# Patient Record
Sex: Male | Born: 1945 | Race: White | Hispanic: No | Marital: Married | Smoking: Former smoker
Health system: Southern US, Community
[De-identification: ages and names within clinical notes are randomized; demographics above are authoritative.]

## PROBLEM LIST (undated history)

## (undated) DIAGNOSIS — I10 Essential (primary) hypertension: Secondary | ICD-10-CM

## (undated) DIAGNOSIS — E7800 Pure hypercholesterolemia, unspecified: Secondary | ICD-10-CM

## (undated) DIAGNOSIS — M5430 Sciatica, unspecified side: Secondary | ICD-10-CM

## (undated) HISTORY — PX: OTHER SURGICAL HISTORY: SHX169

## (undated) HISTORY — PX: BACK SURGERY: SHX140

---

## 2001-01-16 ENCOUNTER — Encounter: Payer: Self-pay | Admitting: Neurosurgery

## 2001-01-18 ENCOUNTER — Encounter: Payer: Self-pay | Admitting: Neurosurgery

## 2001-01-18 ENCOUNTER — Inpatient Hospital Stay (HOSPITAL_COMMUNITY): Admission: RE | Admit: 2001-01-18 | Discharge: 2001-01-19 | Payer: Self-pay | Admitting: Neurosurgery

## 2014-06-23 DIAGNOSIS — I10 Essential (primary) hypertension: Secondary | ICD-10-CM | POA: Diagnosis not present

## 2014-06-23 DIAGNOSIS — E782 Mixed hyperlipidemia: Secondary | ICD-10-CM | POA: Diagnosis not present

## 2014-06-29 DIAGNOSIS — E782 Mixed hyperlipidemia: Secondary | ICD-10-CM | POA: Diagnosis not present

## 2014-06-29 DIAGNOSIS — R7309 Other abnormal glucose: Secondary | ICD-10-CM | POA: Diagnosis not present

## 2014-06-29 DIAGNOSIS — I10 Essential (primary) hypertension: Secondary | ICD-10-CM | POA: Diagnosis not present

## 2014-06-29 DIAGNOSIS — Z6826 Body mass index (BMI) 26.0-26.9, adult: Secondary | ICD-10-CM | POA: Diagnosis not present

## 2014-08-31 DIAGNOSIS — H524 Presbyopia: Secondary | ICD-10-CM | POA: Diagnosis not present

## 2014-08-31 DIAGNOSIS — H2513 Age-related nuclear cataract, bilateral: Secondary | ICD-10-CM | POA: Diagnosis not present

## 2014-09-29 DIAGNOSIS — R1310 Dysphagia, unspecified: Secondary | ICD-10-CM | POA: Diagnosis not present

## 2014-09-29 DIAGNOSIS — K219 Gastro-esophageal reflux disease without esophagitis: Secondary | ICD-10-CM | POA: Diagnosis not present

## 2014-09-29 DIAGNOSIS — Z6825 Body mass index (BMI) 25.0-25.9, adult: Secondary | ICD-10-CM | POA: Diagnosis not present

## 2014-10-05 DIAGNOSIS — K219 Gastro-esophageal reflux disease without esophagitis: Secondary | ICD-10-CM | POA: Diagnosis not present

## 2014-10-05 DIAGNOSIS — R1310 Dysphagia, unspecified: Secondary | ICD-10-CM | POA: Diagnosis not present

## 2014-10-13 DIAGNOSIS — R1310 Dysphagia, unspecified: Secondary | ICD-10-CM | POA: Diagnosis not present

## 2014-10-13 DIAGNOSIS — Z6825 Body mass index (BMI) 25.0-25.9, adult: Secondary | ICD-10-CM | POA: Diagnosis not present

## 2014-10-13 DIAGNOSIS — K219 Gastro-esophageal reflux disease without esophagitis: Secondary | ICD-10-CM | POA: Diagnosis not present

## 2014-10-19 DIAGNOSIS — M5432 Sciatica, left side: Secondary | ICD-10-CM | POA: Diagnosis not present

## 2014-10-27 DIAGNOSIS — R1310 Dysphagia, unspecified: Secondary | ICD-10-CM | POA: Diagnosis not present

## 2014-10-28 DIAGNOSIS — R339 Retention of urine, unspecified: Secondary | ICD-10-CM | POA: Diagnosis not present

## 2014-11-02 DIAGNOSIS — K259 Gastric ulcer, unspecified as acute or chronic, without hemorrhage or perforation: Secondary | ICD-10-CM | POA: Diagnosis not present

## 2014-11-02 DIAGNOSIS — K449 Diaphragmatic hernia without obstruction or gangrene: Secondary | ICD-10-CM | POA: Diagnosis not present

## 2014-11-02 DIAGNOSIS — R338 Other retention of urine: Secondary | ICD-10-CM | POA: Diagnosis not present

## 2014-11-02 DIAGNOSIS — R000 Tachycardia, unspecified: Secondary | ICD-10-CM | POA: Diagnosis not present

## 2014-11-02 DIAGNOSIS — M5417 Radiculopathy, lumbosacral region: Secondary | ICD-10-CM | POA: Diagnosis not present

## 2014-11-02 DIAGNOSIS — R1310 Dysphagia, unspecified: Secondary | ICD-10-CM | POA: Diagnosis not present

## 2014-11-02 DIAGNOSIS — K222 Esophageal obstruction: Secondary | ICD-10-CM | POA: Diagnosis not present

## 2014-11-02 DIAGNOSIS — K2210 Ulcer of esophagus without bleeding: Secondary | ICD-10-CM | POA: Diagnosis not present

## 2014-11-02 DIAGNOSIS — I959 Hypotension, unspecified: Secondary | ICD-10-CM | POA: Diagnosis not present

## 2014-11-04 ENCOUNTER — Emergency Department (HOSPITAL_COMMUNITY): Payer: Medicare Other

## 2014-11-04 ENCOUNTER — Inpatient Hospital Stay (HOSPITAL_COMMUNITY)
Admission: EM | Admit: 2014-11-04 | Discharge: 2014-11-12 | DRG: 542 | Disposition: A | Discharge status: Home Health | Payer: Medicare Other | Attending: Internal Medicine | Admitting: Internal Medicine

## 2014-11-04 ENCOUNTER — Inpatient Hospital Stay (HOSPITAL_COMMUNITY): Payer: Medicare Other

## 2014-11-04 ENCOUNTER — Encounter (HOSPITAL_COMMUNITY): Payer: Self-pay | Admitting: Emergency Medicine

## 2014-11-04 DIAGNOSIS — R339 Retention of urine, unspecified: Secondary | ICD-10-CM | POA: Diagnosis not present

## 2014-11-04 DIAGNOSIS — I82402 Acute embolism and thrombosis of unspecified deep veins of left lower extremity: Secondary | ICD-10-CM | POA: Diagnosis not present

## 2014-11-04 DIAGNOSIS — D649 Anemia, unspecified: Secondary | ICD-10-CM | POA: Diagnosis present

## 2014-11-04 DIAGNOSIS — C7951 Secondary malignant neoplasm of bone: Principal | ICD-10-CM | POA: Diagnosis present

## 2014-11-04 DIAGNOSIS — R5381 Other malaise: Secondary | ICD-10-CM | POA: Diagnosis present

## 2014-11-04 DIAGNOSIS — I10 Essential (primary) hypertension: Secondary | ICD-10-CM | POA: Diagnosis not present

## 2014-11-04 DIAGNOSIS — C7800 Secondary malignant neoplasm of unspecified lung: Secondary | ICD-10-CM | POA: Diagnosis present

## 2014-11-04 DIAGNOSIS — C7830 Secondary malignant neoplasm of unspecified respiratory organ: Secondary | ICD-10-CM | POA: Diagnosis not present

## 2014-11-04 DIAGNOSIS — C7931 Secondary malignant neoplasm of brain: Secondary | ICD-10-CM | POA: Diagnosis not present

## 2014-11-04 DIAGNOSIS — K922 Gastrointestinal hemorrhage, unspecified: Secondary | ICD-10-CM | POA: Diagnosis not present

## 2014-11-04 DIAGNOSIS — E872 Acidosis: Secondary | ICD-10-CM | POA: Diagnosis present

## 2014-11-04 DIAGNOSIS — D62 Acute posthemorrhagic anemia: Secondary | ICD-10-CM | POA: Diagnosis not present

## 2014-11-04 DIAGNOSIS — C799 Secondary malignant neoplasm of unspecified site: Secondary | ICD-10-CM | POA: Diagnosis present

## 2014-11-04 DIAGNOSIS — K921 Melena: Secondary | ICD-10-CM | POA: Diagnosis present

## 2014-11-04 DIAGNOSIS — R0600 Dyspnea, unspecified: Secondary | ICD-10-CM | POA: Diagnosis not present

## 2014-11-04 DIAGNOSIS — T380X5A Adverse effect of glucocorticoids and synthetic analogues, initial encounter: Secondary | ICD-10-CM | POA: Diagnosis present

## 2014-11-04 DIAGNOSIS — J9601 Acute respiratory failure with hypoxia: Secondary | ICD-10-CM | POA: Diagnosis not present

## 2014-11-04 DIAGNOSIS — N179 Acute kidney failure, unspecified: Secondary | ICD-10-CM | POA: Diagnosis not present

## 2014-11-04 DIAGNOSIS — R918 Other nonspecific abnormal finding of lung field: Secondary | ICD-10-CM

## 2014-11-04 DIAGNOSIS — M5442 Lumbago with sciatica, left side: Secondary | ICD-10-CM | POA: Diagnosis not present

## 2014-11-04 DIAGNOSIS — J441 Chronic obstructive pulmonary disease with (acute) exacerbation: Secondary | ICD-10-CM | POA: Diagnosis present

## 2014-11-04 DIAGNOSIS — R0602 Shortness of breath: Secondary | ICD-10-CM | POA: Diagnosis not present

## 2014-11-04 DIAGNOSIS — M549 Dorsalgia, unspecified: Secondary | ICD-10-CM | POA: Diagnosis not present

## 2014-11-04 DIAGNOSIS — Z87891 Personal history of nicotine dependence: Secondary | ICD-10-CM | POA: Diagnosis not present

## 2014-11-04 DIAGNOSIS — R000 Tachycardia, unspecified: Secondary | ICD-10-CM | POA: Diagnosis present

## 2014-11-04 DIAGNOSIS — R195 Other fecal abnormalities: Secondary | ICD-10-CM | POA: Diagnosis not present

## 2014-11-04 DIAGNOSIS — D630 Anemia in neoplastic disease: Secondary | ICD-10-CM | POA: Diagnosis not present

## 2014-11-04 DIAGNOSIS — C801 Malignant (primary) neoplasm, unspecified: Secondary | ICD-10-CM | POA: Diagnosis present

## 2014-11-04 DIAGNOSIS — R739 Hyperglycemia, unspecified: Secondary | ICD-10-CM | POA: Diagnosis not present

## 2014-11-04 DIAGNOSIS — R079 Chest pain, unspecified: Secondary | ICD-10-CM | POA: Diagnosis not present

## 2014-11-04 DIAGNOSIS — I959 Hypotension, unspecified: Secondary | ICD-10-CM | POA: Diagnosis present

## 2014-11-04 DIAGNOSIS — C787 Secondary malignant neoplasm of liver and intrahepatic bile duct: Secondary | ICD-10-CM | POA: Diagnosis present

## 2014-11-04 DIAGNOSIS — M545 Low back pain, unspecified: Secondary | ICD-10-CM | POA: Diagnosis present

## 2014-11-04 DIAGNOSIS — R338 Other retention of urine: Secondary | ICD-10-CM | POA: Diagnosis not present

## 2014-11-04 DIAGNOSIS — E785 Hyperlipidemia, unspecified: Secondary | ICD-10-CM | POA: Diagnosis not present

## 2014-11-04 DIAGNOSIS — N401 Enlarged prostate with lower urinary tract symptoms: Secondary | ICD-10-CM | POA: Diagnosis not present

## 2014-11-04 DIAGNOSIS — Z79899 Other long term (current) drug therapy: Secondary | ICD-10-CM

## 2014-11-04 DIAGNOSIS — G936 Cerebral edema: Secondary | ICD-10-CM | POA: Diagnosis present

## 2014-11-04 DIAGNOSIS — E871 Hypo-osmolality and hyponatremia: Secondary | ICD-10-CM | POA: Diagnosis present

## 2014-11-04 DIAGNOSIS — M5441 Lumbago with sciatica, right side: Secondary | ICD-10-CM | POA: Insufficient documentation

## 2014-11-04 DIAGNOSIS — J449 Chronic obstructive pulmonary disease, unspecified: Secondary | ICD-10-CM | POA: Diagnosis not present

## 2014-11-04 DIAGNOSIS — J189 Pneumonia, unspecified organism: Secondary | ICD-10-CM | POA: Diagnosis not present

## 2014-11-04 DIAGNOSIS — I82409 Acute embolism and thrombosis of unspecified deep veins of unspecified lower extremity: Secondary | ICD-10-CM | POA: Insufficient documentation

## 2014-11-04 DIAGNOSIS — M7989 Other specified soft tissue disorders: Secondary | ICD-10-CM | POA: Diagnosis not present

## 2014-11-04 HISTORY — DX: Pure hypercholesterolemia, unspecified: E78.00

## 2014-11-04 HISTORY — DX: Sciatica, unspecified side: M54.30

## 2014-11-04 HISTORY — DX: Essential (primary) hypertension: I10

## 2014-11-04 LAB — URINE MICROSCOPIC-ADD ON

## 2014-11-04 LAB — CBC
HCT: 23.8 % — ABNORMAL LOW (ref 39.0–52.0)
HEMATOCRIT: 23.5 % — AB (ref 39.0–52.0)
HEMOGLOBIN: 8.4 g/dL — AB (ref 13.0–17.0)
Hemoglobin: 8.1 g/dL — ABNORMAL LOW (ref 13.0–17.0)
MCH: 32.1 pg (ref 26.0–34.0)
MCH: 31.4 pg (ref 26.0–34.0)
MCHC: 35.3 g/dL (ref 30.0–36.0)
MCHC: 34.5 g/dL (ref 30.0–36.0)
MCV: 90.8 fL (ref 78.0–100.0)
MCV: 91.1 fL (ref 78.0–100.0)
PLATELETS: 248 10*3/uL (ref 150–400)
Platelets: 199 10*3/uL (ref 150–400)
RBC: 2.62 MIL/uL — ABNORMAL LOW (ref 4.22–5.81)
RBC: 2.58 MIL/uL — ABNORMAL LOW (ref 4.22–5.81)
RDW: 13.4 % (ref 11.5–15.5)
RDW: 13.5 % (ref 11.5–15.5)
WBC: 13.5 10*3/uL — ABNORMAL HIGH (ref 4.0–10.5)
WBC: 11.6 10*3/uL — ABNORMAL HIGH (ref 4.0–10.5)

## 2014-11-04 LAB — URINALYSIS, ROUTINE W REFLEX MICROSCOPIC
Glucose, UA: NEGATIVE mg/dL
Ketones, ur: 15 mg/dL — AB
Nitrite: NEGATIVE
PH: 5 (ref 5.0–8.0)
PROTEIN: 30 mg/dL — AB
SPECIFIC GRAVITY, URINE: 1.014 (ref 1.005–1.030)
Urobilinogen, UA: 1 mg/dL (ref 0.0–1.0)

## 2014-11-04 LAB — I-STAT ARTERIAL BLOOD GAS, ED
ACID-BASE DEFICIT: 9 mmol/L — AB (ref 0.0–2.0)
BICARBONATE: 14.4 meq/L — AB (ref 20.0–24.0)
O2 SAT: 94 %
PCO2 ART: 24.3 mmHg — AB (ref 35.0–45.0)
PO2 ART: 70 mmHg — AB (ref 80.0–100.0)
Patient temperature: 98.6
TCO2: 15 mmol/L (ref 0–100)
pH, Arterial: 7.38 (ref 7.350–7.450)

## 2014-11-04 LAB — PROTIME-INR
INR: 1.49 (ref 0.00–1.49)
Prothrombin Time: 18.1 seconds — ABNORMAL HIGH (ref 11.6–15.2)

## 2014-11-04 LAB — BASIC METABOLIC PANEL
Anion gap: 14 (ref 5–15)
BUN: 39 mg/dL — ABNORMAL HIGH (ref 6–20)
CO2: 19 mmol/L — ABNORMAL LOW (ref 22–32)
CREATININE: 2.04 mg/dL — AB (ref 0.61–1.24)
Calcium: 8.1 mg/dL — ABNORMAL LOW (ref 8.9–10.3)
Chloride: 92 mmol/L — ABNORMAL LOW (ref 101–111)
GFR calc Af Amer: 36 mL/min — ABNORMAL LOW (ref >60)
GFR calc non Af Amer: 31 mL/min — ABNORMAL LOW (ref >60)
Glucose, Bld: 131 mg/dL — ABNORMAL HIGH (ref 65–99)
POTASSIUM: 3.8 mmol/L (ref 3.5–5.1)
Sodium: 125 mmol/L — ABNORMAL LOW (ref 135–145)

## 2014-11-04 LAB — I-STAT TROPONIN, ED: Troponin i, poc: 0.02 ng/mL (ref 0.00–0.08)

## 2014-11-04 LAB — LACTIC ACID, PLASMA: LACTIC ACID, VENOUS: 1.6 mmol/L (ref 0.5–2.0)

## 2014-11-04 LAB — BRAIN NATRIURETIC PEPTIDE: B Natriuretic Peptide: 63.3 pg/mL (ref 0.0–100.0)

## 2014-11-04 MED ORDER — SODIUM CHLORIDE 0.9 % IV BOLUS (SEPSIS)
1000.0000 mL | Freq: Once | INTRAVENOUS | Status: AC
  Administered 2014-11-04: 1000 mL via INTRAVENOUS

## 2014-11-04 MED ORDER — PANTOPRAZOLE SODIUM 40 MG IV SOLR: 40.0000 mg | Freq: Two times a day (BID) | INTRAVENOUS | Status: DC

## 2014-11-04 MED ORDER — PIPERACILLIN-TAZOBACTAM 3.375 G IVPB
3.3750 g | Freq: Three times a day (TID) | INTRAVENOUS | Status: AC
  Administered 2014-11-05 – 2014-11-08 (×12): 3.375 g via INTRAVENOUS
  Filled 2014-11-04 (×14): qty 50

## 2014-11-04 MED ORDER — SODIUM CHLORIDE 0.9 % IV SOLN
80.0000 mg | Freq: Once | INTRAVENOUS | Status: AC
  Administered 2014-11-04: 80 mg via INTRAVENOUS
  Filled 2014-11-04: qty 80

## 2014-11-04 MED ORDER — PIPERACILLIN-TAZOBACTAM 3.375 G IVPB 30 MIN: 3.3750 g | Freq: Once | INTRAVENOUS | Status: AC

## 2014-11-04 MED ORDER — VANCOMYCIN HCL IN DEXTROSE 1-5 GM/200ML-% IV SOLN
1000.0000 mg | INTRAVENOUS | Status: AC
  Administered 2014-11-05 – 2014-11-08 (×4): 1000 mg via INTRAVENOUS
  Filled 2014-11-04 (×5): qty 200

## 2014-11-04 MED ORDER — PANTOPRAZOLE SODIUM 40 MG IV SOLR
8.0000 mg/h | INTRAVENOUS | Status: DC
  Administered 2014-11-04 – 2014-11-06 (×3): 8 mg/h via INTRAVENOUS
  Filled 2014-11-04 (×8): qty 80

## 2014-11-04 NOTE — ED Provider Notes (Signed)
CSN: [id number]     Arrival date & time 11/04/14  1909 History   First MD Initiated Contact with Stuart Vaughan 11/04/14 1927     Chief Complaint  Stuart Vaughan presents with  . Sciatica  . Shortness of Breath   HPI  Stuart Vaughan is a 69yo man with PMHx of sciatica, HTN, hyperlipidemia presenting with dyspnea. Wife is with Stuart Vaughan and gave most of history. Dyspnea started about 3 days ago and has gradually worsened. Wife also reports Stuart Vaughan has been having dark bloody stools for the past 3 days at least daily. She notes he had an endoscopy 3 days ago at Dr. Carmie End office (GI) in [facility]. She states endoscopy showed a stricture, which was dilated and also showed some ulcers in the esophagus. He denies taking NSAIDs frequently (only Motrin occasionally but has not taken in last week) and alcohol use. Hbg noted to be 8.4 in ED, unclear baseline. Denies chest pain, abdominal pain, nausea, vomiting.   Past Medical History  Diagnosis Date  . Hypertension   . High cholesterol   . Sciatica    History reviewed. No pertinent past surgical history. No family history on file. History  Substance Use Topics  . Smoking status: Former Research scientist (life sciences)  . Smokeless tobacco: Not on file  . Alcohol Use: No    Review of Systems General: Denies fever, chills, night sweats, changes in weight, changes in appetite HEENT: Denies headaches, ear pain, changes in vision, rhinorrhea, sore throat CV: Denies palpitations, orthopnea Pulm: Denies cough, wheezing GI: See above GU: Denies dysuria, hematuria, frequency Msk: Denies muscle cramps, joint pains Neuro: Denies weakness, numbness, tingling Skin: Denies rashes, bruising   Allergies  Review of Stuart Vaughan's allergies indicates not on file.  Home Medications   Prior to Admission medications   Not on File   BP 103/58 mmHg  Pulse 85  Temp(Src) 99.5 F (37.5 C) (Oral)  Resp 28  SpO2 99% Physical Exam General: elderly man sitting up in bed, mild respiratory  distress HEENT: [place]/AT, EOMI, sclera anicteric, mucus membranes moist CV: tachycardic, no m/g/r Pulm: CTA bilaterally, breaths labored on 4 L oxygen via  Abd: BS+, soft, non-tender, non-distended  Rectal: Brown stool with no obvious frank blood, FOBT positive  Ext: warm, no edema  Neuro: alert and oriented x 3, strength intact  ED Course  Procedures (including critical care time) Labs Review Labs Reviewed  CBC - Abnormal; Notable for the following:    WBC 13.5 (*)    RBC 2.62 (*)    Hemoglobin 8.4 (*)    HCT 23.8 (*)    All other components within normal limits  BASIC METABOLIC PANEL - Abnormal; Notable for the following:    Sodium 125 (*)    Chloride 92 (*)    CO2 19 (*)    Glucose, Bld 131 (*)    BUN 39 (*)    Creatinine, Ser 2.04 (*)    Calcium 8.1 (*)    GFR calc non Af Amer 31 (*)    GFR calc Af Amer 36 (*)    All other components within normal limits  BRAIN NATRIURETIC PEPTIDE  PROTIME-INR  I-STAT TROPOININ, ED  POC OCCULT BLOOD, ED  TYPE AND SCREEN    Imaging Review Dg Chest Port 1 View  11/04/2014   CLINICAL DATA:  69 year old male with shortness of breath on exertion. No chest pain.  EXAM: PORTABLE CHEST - 1 VIEW  COMPARISON:  Chest x-ray 01/16/2001.  FINDINGS: There are numerous pulmonary nodules  and masses throughout the lungs bilaterally, highly concerning for widespread metastatic disease. Largest of these measure up to 4 cm in the right upper lobe. No definite pleural effusions. No evidence of pulmonary edema. Heart size is normal. Upper mediastinal contours are within normal limits. Atherosclerosis in the thoracic aorta.  IMPRESSION: 1. Findings, as above, highly concerning for widespread metastatic disease to the lungs. Further evaluation with contrast enhanced chest CT is recommended at this time. 2. Atherosclerosis.   Electronically Signed   By: Vinnie Langton M.D.   On: 11/04/2014 20:33     EKG Interpretation   Date/Time:  Wednesday November 04 2014  19:27:08 EDT Ventricular Rate:  112 PR Interval:  132 QRS Duration: 82 QT Interval:  324 QTC Calculation: 442 R Axis:   66 Text Interpretation:  Sinus tachycardia Otherwise normal ECG No previous  tracing Confirmed by BEATON  MD, ROBERT ([id number]) on 11/04/2014 7:43:08 PM      MDM   Final diagnoses:  Upper GI bleeding   69yo man presenting with 3 day history of dyspnea and dark stools found to have a Hbg of 8.4 and FOBT positive. Stuart Vaughan is having respiratory distress and hypotensive with SBP in 80s. Endoscopy was performed 3 days ago and showed esophageal ulcers and esophageal stricture s/p dilation per wife, no records in EPIC. Will give a 1 L bolus, place on non-rebreather, start IV protonix, type and screen. GI consulted and recommend to make Stuart Vaughan NPO at midnight, likely scope tomorrow. Admit to [facility] team.      Juliet Rude, MD 11/04/14 [id number]  Leonard Schwartz, MD 11/04/14 2118

## 2014-11-04 NOTE — ED Notes (Signed)
ED provider collected occult blood stool per card. MD stated test was positive and ok not to run test in mini lab.

## 2014-11-04 NOTE — ED Notes (Signed)
Pt. Foley leg bag removed and changed to a standard drainage bag.

## 2014-11-04 NOTE — Progress Notes (Signed)
ANTIBIOTIC CONSULT NOTE - INITIAL  Pharmacy Consult for Vancocin and Zosyn Indication: rule out sepsis  No Known Allergies  Patient Measurements: Weight: 150 lb (68.04 kg)  Vital Signs: Temp: 99.5 F (37.5 C) (06/08 1918) Temp Source: Oral (06/08 1918) BP: 101/63 mmHg (06/08 2130) Pulse Rate: 142 (06/08 2130)  Labs:  Recent Labs  11/04/14 1936 11/04/14 2205  WBC 13.5* 11.6*  HGB 8.4* 8.1*  PLT 248 199  CREATININE 2.04*  --     Medical History: Past Medical History  Diagnosis Date  . Hypertension   . High cholesterol   . Sciatica      Assessment: 69yo male c/o SOB x1.5 wk and swelling to BLE, found to be hypotensive w/ possible GIB, has had Foley in place for urinary retention, concern for sepsis, to begin IV ABX.  Goal of Therapy:  Vancomycin trough level 15-20 mcg/ml  Plan:  Will begin vancomycin 1000mg  IV Q24H and Zosyn 3.375g IV Q8H and monitor CBC, Cx, levels prn.  Wynona Neat, PharmD, BCPS  11/04/2014,11:21 PM

## 2014-11-04 NOTE — H&P (Addendum)
[facility] History and Physical  Stuart Vaughan WIO:[id number] DOB: Feb 21, 1946 DOA: 11/04/2014  Referring physician: Dr.Rivet. PCP: Maryella Shivers, MD  Specialists: None.  Chief Complaint: Low back pain.  HPI: Stuart Vaughan is a 69 y.o. male with history of hypertension and hyperlipidemia has been experiencing increasing low back pain over the last 3 weeks. Patient had gone to ER at [facility] 2 weeks ago and as per the family was diagnosed with sciatica and discharged home. Patient had to go to the ER again because of urinary retention and worsening pain and at that time patient was placed on Foley catheter and was referred to urologist and patient had followed up with urologist yesterday. As per the family urologist tried to discontinue the Foley but had to replace again because of urinary retention. Original plan was to get MRI of lumbar spine. Patient had come to the ER because of worsening pain last evening. In the ER patient also was found to be short of breath and initially was hypotensive with tachycardia. Patient's blood work showed hemoglobin of around 8 and as per the ER physician to focal blood was positive but not melanotic. Patient was given 2 L normal seen bolus following which patient's tachycardia and blood pressure improved. Chest x-ray shows multiple metastases. On exam patient has significant pain in his lower back on moving his legs. Patient also has hyperreflexia. Patient is wearing a Foley catheter and states that for the last 1 week has been having bowel incontinence. Patient also has mild fever. Patient is requiring at least 6 L oxygen. Over the last 3 days patient has been having some rectal bleeding off and on. Patient has been using some NSAIDs and 3 days ago has had EGD done at [facility] for esophageal stricture dilation. As per the family during the EGD patient had semi-separate ulcers. Patient abdomen on exam at this time appears benign though stool for occult  blood was positive per the ER physician.  Review of Systems: As presented in the history of presenting illness, rest negative.  Past Medical History  Diagnosis Date  . Hypertension   . High cholesterol   . Sciatica    Past Surgical History  Procedure Laterality Date  . Cyst removal from spine    . Back surgery     Social History:  reports that he has quit smoking. He does not have any smokeless tobacco history on file. He reports that he does not drink alcohol or use illicit drugs. Where does patient live home. Can patient participate in ADLs? Not sure.  No Known Allergies  Family History:  Family History  Problem Relation Age of Onset  . Diabetes Mellitus II Neg Hx       Prior to Admission medications   Medication Sig Start Date End Date Taking? Authorizing Provider  bethanechol (URECHOLINE) 50 MG tablet Take 50 mg by mouth 2 (two) times daily.   Yes Historical Provider, MD  HYDROcodone-acetaminophen (NORCO/VICODIN) 5-325 MG per tablet Take 1 tablet by mouth every 6 (six) hours as needed for moderate pain.   Yes Historical Provider, MD  levofloxacin (LEVAQUIN) 250 MG tablet Take 250 mg by mouth daily.   Yes Historical Provider, MD  omeprazole (PRILOSEC) 40 MG capsule Take 40 mg by mouth 2 (two) times daily.   Yes Historical Provider, MD  pravastatin (PRAVACHOL) 10 MG tablet Take 10 mg by mouth daily.   Yes Historical Provider, MD  tamsulosin (FLOMAX) 0.4 MG CAPS capsule Take 0.4 mg by mouth  daily after supper.   Yes Historical Provider, MD  valsartan-hydrochlorothiazide (DIOVAN-HCT) 320-25 MG per tablet Take 1 tablet by mouth daily.   Yes Historical Provider, MD    Physical Exam: Filed Vitals:   11/04/14 1949 11/04/14 2030 11/04/14 2115 11/04/14 2130  BP:  95/60 86/71 101/63  Pulse:  51 100 142  Temp:      TempSrc:      Resp:  25 28 23   Weight:      SpO2: 98%  95% 93%     General:  Moderately built and nourished.  Eyes: Anicteric no pallor.  ENT: No discharge  from the ears eyes nose and mouth.  Neck: No mass felt. No JVD appreciated.  Cardiovascular: S1 and S2 heard.  Respiratory: No rhonchi or crepitations.  Abdomen: Soft nontender bowel sounds present.  Skin: Rash around the groin area.  Musculoskeletal: Mild lower extremity edema.  Psychiatric: Appears normal.  Neurologic: Alert awake oriented to time place and person. Patient has significant pain on moving lower extremities. Patient has hyperreflexia of the lower extremities.  Labs on Admission:  Basic Metabolic Panel:  Recent Labs Lab 11/04/14 1936  NA 125*  K 3.8  CL 92*  CO2 19*  GLUCOSE 131*  BUN 39*  CREATININE 2.04*  CALCIUM 8.1*   Liver Function Tests: No results for input(s): AST, ALT, ALKPHOS, BILITOT, PROT, ALBUMIN in the last 168 hours. No results for input(s): LIPASE, AMYLASE in the last 168 hours. No results for input(s): AMMONIA in the last 168 hours. CBC:  Recent Labs Lab 11/04/14 1936 11/04/14 2205  WBC 13.5* 11.6*  HGB 8.4* 8.1*  HCT 23.8* 23.5*  MCV 90.8 91.1  PLT 248 199   Cardiac Enzymes: No results for input(s): CKTOTAL, CKMB, CKMBINDEX, TROPONINI in the last 168 hours.  BNP (last 3 results)  Recent Labs  11/04/14 1936  BNP 63.3    ProBNP (last 3 results) No results for input(s): PROBNP in the last 8760 hours.  CBG: No results for input(s): GLUCAP in the last 168 hours.  Radiological Exams on Admission: Dg Chest Port 1 View  11/04/2014   CLINICAL DATA:  69 year old male with shortness of breath on exertion. No chest pain.  EXAM: PORTABLE CHEST - 1 VIEW  COMPARISON:  Chest x-ray 01/16/2001.  FINDINGS: There are numerous pulmonary nodules and masses throughout the lungs bilaterally, highly concerning for widespread metastatic disease. Largest of these measure up to 4 cm in the right upper lobe. No definite pleural effusions. No evidence of pulmonary edema. Heart size is normal. Upper mediastinal contours are within normal limits.  Atherosclerosis in the thoracic aorta.  IMPRESSION: 1. Findings, as above, highly concerning for widespread metastatic disease to the lungs. Further evaluation with contrast enhanced chest CT is recommended at this time. 2. Atherosclerosis.   Electronically Signed   By: Vinnie Langton M.D.   On: 11/04/2014 20:33     Assessment/Plan Active Problems:   Upper GI bleeding   Acute respiratory failure with hypoxia   ARF (acute renal failure)   Normocytic anemia   Hypertension   Urinary retention   1. Acute respiratory failure with hypoxia - cause not clear. Patient's chest x-ray shows multiple metastases and patient also has anemia. Patient at this time is requiring 6 L oxygen. Patient is also mildly febrile. I have requested pulmonary critical care consult. We will check 2-D echo and if there is further drop in hemoglobin we will transfuse packed red blood cell. Closely observe in stepdown. Check  d-dimer. Patient eventually will need CT chest for studying possible metastatic disease to the lung. 2. Fever with possible sepsis - source not clear. Patient has a rash around his groin area which could be the source. Patient also has rectal bleeding so check for stool for C. difficile. I have ordered blood cultures urine cultures procalcitonin levels and lactic acid levels. I'm placing patient empiric day on vancomycin and Zosyn. Since patient also has been having some rectal bleeding over the last 3 days have ordered CT abdomen and pelvis with oral contrast and also check LFTs. 3. GI bleed - patient has anemia and stool for blood was positive. ER physician had consulted the on-call gastroenterologist and will be kept nothing by mouth in anticipation of GI procedures. Follow CBC closely. Transfuse if patient becomes hypotensive again or hemoglobin drops. 4. Low back pain with radiation to the left lower extremity - since patient is complaining of bowel incontinence I have ordered a stat MRI of the L-spine  and also ordered MRI of the T and C-spine to check for any cord compression or metastatic lesions. 5. Acute blood loss anemia - baseline hemoglobin is not known. Closely follow CBC. 6. Acute renal failure with urinary retention - follow CT abdomen and pelvis for any urinary tract obstruction or hydronephrosis. At this time patient will be continued on Foley and follow urine cultures. 7. Metastatic lesions in the chest x-ray - I have ordered MRI of the L-spine stat and also MRI of the T-spine and C-spine and patient will eventually required full metastatic workup and I have also consult pulmonary critical care.  I have discussed on-call pulmonary critical care for consult on this patient.   DVT Prophylaxis SCDs.  Code Status: Full code.  Family Communication: Patient's wife and daughter.  Disposition Plan: Admit to inpatient.    Dougles Kimmey N. [facility] Pager [phone].  If 7PM-7AM, please contact night-coverage [email or web address] Password [email or web address] 11/04/2014, 11:11 PM

## 2014-11-04 NOTE — ED Notes (Addendum)
C/o L hip pain that radiates down L leg x 3 weeks.  No known injury.  Seen at [facility] ED at that time and diagnosed with sciatica.  Reports pain did not improve with pain medication.  Seen again 1 week later for urinary retention.  Reports he had a foley placed with 2 liters of urine return.  Pt still has foley in place.  Pt very sob on arrival to triage.  Reports sob x 1 1/2 weeks.  Also c/o swelling to bilateral lower extremities.

## 2014-11-05 ENCOUNTER — Encounter (HOSPITAL_COMMUNITY): Payer: Self-pay | Admitting: Pulmonary Disease

## 2014-11-05 ENCOUNTER — Inpatient Hospital Stay (HOSPITAL_COMMUNITY): Payer: Medicare Other

## 2014-11-05 ENCOUNTER — Encounter (HOSPITAL_COMMUNITY)
Admission: EM | Disposition: A | Discharge status: Home Health | Payer: Self-pay | Source: Home / Self Care | Attending: Internal Medicine

## 2014-11-05 ENCOUNTER — Ambulatory Visit: Payer: Medicare Other | Attending: Radiation Oncology | Admitting: Radiation Oncology

## 2014-11-05 DIAGNOSIS — M5441 Lumbago with sciatica, right side: Secondary | ICD-10-CM

## 2014-11-05 DIAGNOSIS — R918 Other nonspecific abnormal finding of lung field: Secondary | ICD-10-CM

## 2014-11-05 DIAGNOSIS — R338 Other retention of urine: Secondary | ICD-10-CM

## 2014-11-05 DIAGNOSIS — J189 Pneumonia, unspecified organism: Secondary | ICD-10-CM

## 2014-11-05 DIAGNOSIS — C801 Malignant (primary) neoplasm, unspecified: Secondary | ICD-10-CM

## 2014-11-05 DIAGNOSIS — R0600 Dyspnea, unspecified: Secondary | ICD-10-CM

## 2014-11-05 DIAGNOSIS — C7951 Secondary malignant neoplasm of bone: Principal | ICD-10-CM

## 2014-11-05 DIAGNOSIS — J9601 Acute respiratory failure with hypoxia: Secondary | ICD-10-CM

## 2014-11-05 DIAGNOSIS — N179 Acute kidney failure, unspecified: Secondary | ICD-10-CM | POA: Diagnosis present

## 2014-11-05 DIAGNOSIS — C799 Secondary malignant neoplasm of unspecified site: Secondary | ICD-10-CM | POA: Diagnosis present

## 2014-11-05 DIAGNOSIS — D649 Anemia, unspecified: Secondary | ICD-10-CM

## 2014-11-05 DIAGNOSIS — M5442 Lumbago with sciatica, left side: Secondary | ICD-10-CM

## 2014-11-05 DIAGNOSIS — N289 Disorder of kidney and ureter, unspecified: Secondary | ICD-10-CM

## 2014-11-05 DIAGNOSIS — M549 Dorsalgia, unspecified: Secondary | ICD-10-CM

## 2014-11-05 DIAGNOSIS — J441 Chronic obstructive pulmonary disease with (acute) exacerbation: Secondary | ICD-10-CM | POA: Diagnosis present

## 2014-11-05 DIAGNOSIS — C787 Secondary malignant neoplasm of liver and intrahepatic bile duct: Secondary | ICD-10-CM

## 2014-11-05 DIAGNOSIS — D62 Acute posthemorrhagic anemia: Secondary | ICD-10-CM | POA: Diagnosis present

## 2014-11-05 DIAGNOSIS — R339 Retention of urine, unspecified: Secondary | ICD-10-CM

## 2014-11-05 DIAGNOSIS — M545 Low back pain, unspecified: Secondary | ICD-10-CM | POA: Diagnosis present

## 2014-11-05 DIAGNOSIS — K922 Gastrointestinal hemorrhage, unspecified: Secondary | ICD-10-CM | POA: Diagnosis present

## 2014-11-05 DIAGNOSIS — R740 Nonspecific elevation of levels of transaminase and lactic acid dehydrogenase [LDH]: Secondary | ICD-10-CM

## 2014-11-05 LAB — HEPATIC FUNCTION PANEL
ALBUMIN: 2.3 g/dL — AB (ref 3.5–5.0)
ALT: 85 U/L — ABNORMAL HIGH (ref 17–63)
AST: 161 U/L — ABNORMAL HIGH (ref 15–41)
Alkaline Phosphatase: 228 U/L — ABNORMAL HIGH (ref 38–126)
BILIRUBIN DIRECT: 1 mg/dL — AB (ref 0.1–0.5)
BILIRUBIN TOTAL: 2.2 mg/dL — AB (ref 0.3–1.2)
Indirect Bilirubin: 1.2 mg/dL — ABNORMAL HIGH (ref 0.3–0.9)
TOTAL PROTEIN: 5.2 g/dL — AB (ref 6.5–8.1)

## 2014-11-05 LAB — COMPREHENSIVE METABOLIC PANEL
ALBUMIN: 2.2 g/dL — AB (ref 3.5–5.0)
ALT: 80 U/L — AB (ref 17–63)
ANION GAP: 11 (ref 5–15)
AST: 151 U/L — ABNORMAL HIGH (ref 15–41)
Alkaline Phosphatase: 214 U/L — ABNORMAL HIGH (ref 38–126)
BILIRUBIN TOTAL: 2 mg/dL — AB (ref 0.3–1.2)
BUN: 30 mg/dL — AB (ref 6–20)
CHLORIDE: 100 mmol/L — AB (ref 101–111)
CO2: 18 mmol/L — ABNORMAL LOW (ref 22–32)
CREATININE: 1.57 mg/dL — AB (ref 0.61–1.24)
Calcium: 7.5 mg/dL — ABNORMAL LOW (ref 8.9–10.3)
GFR calc Af Amer: 50 mL/min — ABNORMAL LOW (ref >60)
GFR calc non Af Amer: 43 mL/min — ABNORMAL LOW (ref >60)
GLUCOSE: 157 mg/dL — AB (ref 65–99)
Potassium: 3.9 mmol/L (ref 3.5–5.1)
SODIUM: 129 mmol/L — AB (ref 135–145)
TOTAL PROTEIN: 4.8 g/dL — AB (ref 6.5–8.1)

## 2014-11-05 LAB — CBC
HCT: 21.8 % — ABNORMAL LOW (ref 39.0–52.0)
HEMATOCRIT: 22 % — AB (ref 39.0–52.0)
HEMATOCRIT: 22.5 % — AB (ref 39.0–52.0)
HEMOGLOBIN: 7.7 g/dL — AB (ref 13.0–17.0)
Hemoglobin: 7.6 g/dL — ABNORMAL LOW (ref 13.0–17.0)
Hemoglobin: 7.5 g/dL — ABNORMAL LOW (ref 13.0–17.0)
MCH: 31.4 pg (ref 26.0–34.0)
MCH: 31.5 pg (ref 26.0–34.0)
MCH: 31.6 pg (ref 26.0–34.0)
MCHC: 34.5 g/dL (ref 30.0–36.0)
MCHC: 34.4 g/dL (ref 30.0–36.0)
MCHC: 34.2 g/dL (ref 30.0–36.0)
MCV: 90.9 fL (ref 78.0–100.0)
MCV: 91.6 fL (ref 78.0–100.0)
MCV: 92.2 fL (ref 78.0–100.0)
PLATELETS: 209 10*3/uL (ref 150–400)
PLATELETS: 218 10*3/uL (ref 150–400)
Platelets: 189 10*3/uL (ref 150–400)
RBC: 2.42 MIL/uL — ABNORMAL LOW (ref 4.22–5.81)
RBC: 2.38 MIL/uL — AB (ref 4.22–5.81)
RBC: 2.44 MIL/uL — ABNORMAL LOW (ref 4.22–5.81)
RDW: 13.5 % (ref 11.5–15.5)
RDW: 13.6 % (ref 11.5–15.5)
RDW: 13.6 % (ref 11.5–15.5)
WBC: 12.1 10*3/uL — AB (ref 4.0–10.5)
WBC: 10.1 10*3/uL (ref 4.0–10.5)
WBC: 12.6 10*3/uL — ABNORMAL HIGH (ref 4.0–10.5)

## 2014-11-05 LAB — LACTIC ACID, PLASMA
Lactic Acid, Venous: 1.3 mmol/L (ref 0.5–2.0)
Lactic Acid, Venous: 1.4 mmol/L (ref 0.5–2.0)

## 2014-11-05 LAB — D-DIMER, QUANTITATIVE: D-Dimer, Quant: 16.71 ug/mL-FEU — ABNORMAL HIGH (ref 0.00–0.48)

## 2014-11-05 LAB — TROPONIN I
Troponin I: <0.03 ng/mL (ref <0.031)
Troponin I: <0.03 ng/mL (ref <0.031)

## 2014-11-05 LAB — PREPARE RBC (CROSSMATCH)

## 2014-11-05 LAB — GLUCOSE, CAPILLARY
GLUCOSE-CAPILLARY: 274 mg/dL — AB (ref 65–99)
Glucose-Capillary: 183 mg/dL — ABNORMAL HIGH (ref 65–99)
Glucose-Capillary: 140 mg/dL — ABNORMAL HIGH (ref 65–99)

## 2014-11-05 LAB — CLOSTRIDIUM DIFFICILE BY PCR: Toxigenic C. Difficile by PCR: NEGATIVE

## 2014-11-05 LAB — BRAIN NATRIURETIC PEPTIDE: B Natriuretic Peptide: 56.4 pg/mL (ref 0.0–100.0)

## 2014-11-05 LAB — ABO/RH: ABO/RH(D): O POS

## 2014-11-05 LAB — MRSA PCR SCREENING: MRSA BY PCR: NEGATIVE

## 2014-11-05 LAB — PROCALCITONIN: Procalcitonin: 1.28 ng/mL

## 2014-11-05 LAB — APTT: aPTT: 37 seconds (ref 24–37)

## 2014-11-05 SURGERY — EGD (ESOPHAGOGASTRODUODENOSCOPY)
Anesthesia: Moderate Sedation

## 2014-11-05 MED ORDER — IOHEXOL 300 MG/ML  SOLN
25.0000 mL | INTRAMUSCULAR | Status: AC
  Administered 2014-11-05 (×2): 25 mL via ORAL

## 2014-11-05 MED ORDER — SODIUM CHLORIDE 0.9 % IV SOLN
INTRAVENOUS | Status: DC
  Administered 2014-11-05: 02:00:00 via INTRAVENOUS

## 2014-11-05 MED ORDER — DM-GUAIFENESIN ER 30-600 MG PO TB12
1.0000 | ORAL_TABLET | Freq: Two times a day (BID) | ORAL | Status: DC
  Administered 2014-11-05 – 2014-11-12 (×13): 1 via ORAL
  Filled 2014-11-05 (×16): qty 1

## 2014-11-05 MED ORDER — SODIUM CHLORIDE 0.9 % IV SOLN: Freq: Once | INTRAVENOUS | Status: DC

## 2014-11-05 MED ORDER — IPRATROPIUM-ALBUTEROL 0.5-2.5 (3) MG/3ML IN SOLN
3.0000 mL | Freq: Three times a day (TID) | RESPIRATORY_TRACT | Status: DC
  Administered 2014-11-06 – 2014-11-12 (×17): 3 mL via RESPIRATORY_TRACT
  Filled 2014-11-05 (×18): qty 3

## 2014-11-05 MED ORDER — ALBUTEROL SULFATE (2.5 MG/3ML) 0.083% IN NEBU
2.5000 mg | INHALATION_SOLUTION | RESPIRATORY_TRACT | Status: DC | PRN
  Administered 2014-11-08 – 2014-11-09 (×2): 2.5 mg via RESPIRATORY_TRACT
  Filled 2014-11-05 (×2): qty 3

## 2014-11-05 MED ORDER — ACETAMINOPHEN 325 MG PO TABS: 650.0000 mg | ORAL_TABLET | Freq: Four times a day (QID) | ORAL | Status: DC | PRN

## 2014-11-05 MED ORDER — DEXAMETHASONE SODIUM PHOSPHATE 4 MG/ML IJ SOLN
4.0000 mg | INTRAMUSCULAR | Status: DC
  Administered 2014-11-05 – 2014-11-07 (×10): 4 mg via INTRAVENOUS
  Filled 2014-11-05 (×17): qty 1

## 2014-11-05 MED ORDER — ACETAMINOPHEN 650 MG RE SUPP: 650.0000 mg | Freq: Four times a day (QID) | RECTAL | Status: DC | PRN

## 2014-11-05 MED ORDER — MORPHINE SULFATE 2 MG/ML IJ SOLN
2.0000 mg | INTRAMUSCULAR | Status: DC | PRN
  Administered 2014-11-05: 2 mg via INTRAVENOUS
  Filled 2014-11-05: qty 1

## 2014-11-05 MED ORDER — IPRATROPIUM-ALBUTEROL 0.5-2.5 (3) MG/3ML IN SOLN
3.0000 mL | Freq: Four times a day (QID) | RESPIRATORY_TRACT | Status: DC
  Administered 2014-11-05 (×2): 3 mL via RESPIRATORY_TRACT
  Filled 2014-11-05 (×2): qty 3

## 2014-11-05 MED ORDER — ONDANSETRON HCL 4 MG PO TABS: 4.0000 mg | ORAL_TABLET | Freq: Four times a day (QID) | ORAL | Status: DC | PRN

## 2014-11-05 MED ORDER — ONDANSETRON HCL 4 MG/2ML IJ SOLN
4.0000 mg | Freq: Four times a day (QID) | INTRAMUSCULAR | Status: DC | PRN
  Administered 2014-11-07: 4 mg via INTRAVENOUS
  Filled 2014-11-05: qty 2

## 2014-11-05 MED ORDER — DEXAMETHASONE SODIUM PHOSPHATE 10 MG/ML IJ SOLN
10.0000 mg | Freq: Once | INTRAMUSCULAR | Status: AC
  Administered 2014-11-05: 10 mg via INTRAVENOUS
  Filled 2014-11-05: qty 1

## 2014-11-05 NOTE — Consult Note (Addendum)
Subjective:   HPI  The patient is a 69 year old male with hypertension and hyperlipidemia who has been experiencing increasing low back pain over the last several weeks. He was diagnosed with sciatica a few weeks ago. He also went to the emergency room because of urinary retention and had a Foley catheter placed. He went back to see the urologist yesterday who took the catheter out but patient still could not urinate and saw the catheter had to be replaced. The patient's wife stated that they said they thought the urinary retention was from the sciatica area he came to our emergency room last night because of shortness of breath and he was found to be somewhat hypotensive and tachycardic. He was found to have a hemoglobin of around 8. He was found to have heme positive stool. A chest x-ray showed metastatic disease. An MRI of the spine shows lesion in the lumbar region. The patient had an EGD with esophageal dilation of an esophageal stricture up to 1 [place] on Monday of this week which was 4 days ago. He also had an esophageal ulcer which was biopsied and the biopsy was negative. He was found to have heme positive stool in the ER here but no melena. He is never had a colonoscopy. 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  Review of Systems Back pain. Urinary retention. Shortness of breath. No chest pain.  Past Medical History  Diagnosis Date  . Hypertension   . High cholesterol   . Sciatica    Past Surgical History  Procedure Laterality Date  . Cyst removal from spine    . Back surgery     History   Social History  . Marital Status: Married    Spouse Name: N/A  . Number of Children: N/A  . Years of Education: N/A   Occupational History  . Not on file.   Social History Main Topics  . Smoking status: Former Research scientist (life sciences)  . Smokeless tobacco: Not on file  . Alcohol Use: No  . Drug Use: No  . Sexual Activity: Not on file   Other Topics Concern  . Not on file   Social History Narrative   . No narrative on file   family history is negative for Diabetes Mellitus II.  Current facility-administered medications:  .  0.9 %  sodium chloride infusion, , Intravenous, Continuous, Rise Patience, MD, Last Rate: 100 mL/hr at 11/05/14 0142 .  0.9 %  sodium chloride infusion, , Intravenous, Once, Rise Patience, MD .  acetaminophen (TYLENOL) tablet 650 mg, 650 mg, Oral, Q6H PRN **OR** acetaminophen (TYLENOL) suppository 650 mg, 650 mg, Rectal, Q6H PRN, Rise Patience, MD .  morphine 2 MG/ML injection 2 mg, 2 mg, Intravenous, Q4H PRN, Rise Patience, MD, 2 mg at 11/05/14 0145 .  ondansetron (ZOFRAN) tablet 4 mg, 4 mg, Oral, Q6H PRN **OR** ondansetron (ZOFRAN) injection 4 mg, 4 mg, Intravenous, Q6H PRN, Rise Patience, MD .  pantoprazole (PROTONIX) 80 mg in sodium chloride 0.9 % 250 mL (0.32 mg/mL) infusion, 8 mg/hr, Intravenous, Continuous, Rise Patience, MD, Last Rate: 25 mL/hr at 11/05/14 0248, 8 mg/hr at 11/05/14 0248 .  [START ON 11/08/2014] pantoprazole (PROTONIX) injection 40 mg, 40 mg, Intravenous, Q12H, Rise Patience, MD .  piperacillin-tazobactam (ZOSYN) IVPB 3.375 g, 3.375 g, Intravenous, Q8H, Veronda P Bryk, RPH, 3.375 g at 11/05/14 0343 .  vancomycin (VANCOCIN) IVPB 1000 mg/200 mL premix, 1,000 mg, Intravenous, Q24H, Veronda P Bryk, RPH, 1,000 mg at 11/05/14  0205 No Known Allergies   Objective:     BP 108/55 mmHg  Pulse 104  Temp(Src) 100.2 F (37.9 C) (Oral)  Resp 26  Ht 5\' 5"  (1.651 m)  Wt 69.4 kg (153 lb)  BMI 25.46 kg/m2  SpO2 98%  He is in no acute distress  Nonicteric  Heart regular rhythm no murmurs  Lungs wheezing bilaterally  Abdomen: Bowel sounds normal, soft, nontender  Laboratory No components found for: D1    Assessment:     #1. Abnormal chest x-ray showing metastatic disease.  #2. Heme positive stool and anemia which I think that certainly be explained from his recent EGD with dilatation and the esophageal  ulcer that was found 4 days ago.  #3. Urinary retention  #4. Sciatic nerve pain  #5. Lumbar lesion per MRI  #6. Elevated liver enzymes / Mets in Liver      Plan:     From a GI standpoint I would follow his hemoglobin and hematocrit. I see no reason at this time to do another endoscopy. Proceed with further workup of the metastatic lung lesions. Treat shortness of breath. Follow labs Lab Results  Component Value Date   HGB 7.5* 11/05/2014   HGB 7.6* 11/05/2014   HGB 8.1* 11/04/2014   HCT 21.8* 11/05/2014   HCT 22.0* 11/05/2014   HCT 23.5* 11/04/2014   ALKPHOS 214* 11/05/2014   ALKPHOS 228* 11/05/2014   AST 151* 11/05/2014   AST 161* 11/05/2014   ALT 80* 11/05/2014   ALT 85* 11/05/2014

## 2014-11-05 NOTE — H&P (Signed)
**Note Stuart-Identified via Obfuscation** Reason for Consult: Lung nodules, liver lesions, bone lesions  Chief Complaint: Chief Complaint  Patient presents with  . Sciatica  . Shortness of Breath   Referring Physician(s): PCCM  History of Present Illness: Stuart Vaughan is a 69 y.o. male who was recently seen at [facility] [address] and now admitted with c/o LLE pain, back pain and urinary retention. Imaging revealed widely metastatic disease and PCCM and Oncology has evaluated the patient with request for biopsy. The patient admits to ongoing back pain and LLE pain. He denies any headaches or vision changes. He denies any chest pain, shortness of breath or palpitations. He has previously tolerated sedation without complications. I had a discussion with patient and family today regarding imaging findings and IR will proceed with US guided liver lesion biopsy tomorrow.   Past Medical History  Diagnosis Date  . Hypertension   . High cholesterol   . Sciatica     Past Surgical History  Procedure Laterality Date  . Cyst removal from spine    . Back surgery      Allergies: Review of patient's allergies indicates no known allergies.  Medications: Prior to Admission medications   Medication Sig Start Date End Date Taking? Authorizing Provider  bethanechol (URECHOLINE) 50 MG tablet Take 50 mg by mouth 2 (two) times daily.   Yes Historical Provider, MD  HYDROcodone-acetaminophen (NORCO/VICODIN) 5-325 MG per tablet Take 1 tablet by mouth every 6 (six) hours as needed for moderate pain.   Yes Historical Provider, MD  levofloxacin (LEVAQUIN) 250 MG tablet Take 250 mg by mouth daily.   Yes Historical Provider, MD  omeprazole (PRILOSEC) 40 MG capsule Take 40 mg by mouth 2 (two) times daily.   Yes Historical Provider, MD  pravastatin (PRAVACHOL) 10 MG tablet Take 10 mg by mouth daily.   Yes Historical Provider, MD  tamsulosin (FLOMAX) 0.4 MG CAPS capsule Take 0.4 mg by mouth daily after supper.   Yes Historical Provider, MD    valsartan-hydrochlorothiazide (DIOVAN-HCT) 320-25 MG per tablet Take 1 tablet by mouth daily.   Yes Historical Provider, MD     Family History  Problem Relation Age of Onset  . Diabetes Mellitus II Neg Hx     History   Social History  . Marital Status: Married    Spouse Name: N/A  . Number of Children: N/A  . Years of Education: N/A   Social History Main Topics  . Smoking status: Former Smoker -- 1.00 packs/day for 50 years    Types: Cigarettes    Quit date: 11/05/2011  . Smokeless tobacco: Not on file  . Alcohol Use: No  . Drug Use: No  . Sexual Activity: Not on file   Other Topics Concern  . None   Social History Narrative   Review of Systems: A 12 point ROS discussed and pertinent positives are indicated in the HPI above.  All other systems are negative.  Review of Systems  Vital Signs: BP 109/77 mmHg  Pulse 103  Temp(Src) 98.6 F (37 C) (Oral)  Resp 32  Ht 5\' 5"  (1.651 m)  Wt 153 lb (69.4 kg)  BMI 25.46 kg/m2  SpO2 96%  Physical Exam  Constitutional: He is oriented to person, place, and time. No distress.  HENT:  Head: Normocephalic and atraumatic.  Neck: No tracheal deviation present.  Cardiovascular: Exam reveals no gallop and no friction rub.   No murmur heard. Tachycardic  Pulmonary/Chest: Effort normal and breath sounds normal. No respiratory distress. He  has no wheezes. He has no rales.  Abdominal: Soft. Bowel sounds are normal. He exhibits no distension. There is no tenderness.  Neurological: He is alert and oriented to person, place, and time.  Skin: He is not diaphoretic.   Mallampati Score:  MD Evaluation Airway: WNL Heart: WNL Abdomen: WNL Chest/ Lungs: WNL ASA  Classification: 3 Mallampati/Airway Score: Two  Imaging: Ct Abdomen Pelvis Wo Contrast  11/05/2014   CLINICAL DATA:  69 year old with GI bleed and pulmonary embolus.  EXAM: CT CHEST, ABDOMEN AND PELVIS WITHOUT CONTRAST  TECHNIQUE: Multidetector CT imaging of the chest,  abdomen and pelvis was performed following the standard protocol without IV contrast.  COMPARISON:  None.  FINDINGS: CT CHEST FINDINGS  Mediastinum: The heart size is normal. No pericardial effusion. Small hiatal hernia noted. Aortic atherosclerosis is identified. There also calcifications within the LAD and left circumflex coronary artery. Left paratracheal lymph node measures 1.4 cm, image 23/series 2. Sub- carinal lymph node measures 1.1 cm, image 28/series 2.  Lungs/Pleura: Advanced changes of centrilobular and paraseptal emphysema. Numerous pulmonary nodules are identified bilaterally. Index nodule within the right upper lobe measures 3.4 cm, image 17/series 3. In the left upper lobe there is a nodule measuring 2.4 cm, image 22/series 3. Within the right lower lobe there is a 1.9 cm nodule, image 42/series 3.  Musculoskeletal: No aggressive lytic or sclerotic bone lesions. T8 compression fracture is again noted. T2 lesion identified on recent MRI is not well seen.  CT ABDOMEN AND PELVIS FINDINGS  Hepatobiliary: Extensive liver metastases are identified involving both lobes. Large index lesion within the right hepatic lobe measures 6.2 cm, image 51/series 2. Index lesion within the lateral segment of left lobe measures 6.3 cm, image 51/series 2. Index lesion within the inferior right hepatic lobe measures 2.3 cm, image 69/series 2. The gallbladder appears normal. No biliary dilatation.  Pancreas: Masslike enlargement of the head of pancreas measures 3.8 cm, image 68/series 2.  Spleen: There is a low attenuation structure within the central spleen measuring 1.9 cm, image 53/series 2.  Adrenals/Urinary Tract: The adrenal glands are both normal. Bilateral renal cysts are identified. These are incompletely characterized without IV contrast. The urinary bladder is collapsed around a Foley catheter balloon.  Stomach/Bowel: Hiatal hernia noted. The stomach is otherwise unremarkable. The small bowel loops have a normal  course and caliber. A 3.3 cm soft tissue attenuating filling defects arise from the lateral wall of the ascending colon, image 87/ series 2. This is of uncertain significance.  Vascular/Lymphatic: Calcified atherosclerotic disease involves the abdominal aorta. Infrarenal abdominal aortic aneurysm measures 3.1 cm.  Reproductive: Prostate gland appears enlarged. Symmetric appearance of the seminal vesicles.  Other: Increased presacral soft tissue along the left is identified, image 95/series 2 and is worrisome for tumor.  Musculoskeletal: Large mass involving the sacrum is identified. This measures at least 6.3 cm, image 91/series 2. This is better seen on MRI from 11/04/14  IMPRESSION: 1. Extensive pulmonary metastasis and liver metastasis. 2. Mass involving head of pancreas may represent a focus of metastatic disease or primary pancreatic carcinoma. 3. Bone metastasis are better seen on MRI from 11/04/14 an 11/05/2014. There is a large mass involving the central portion of the sacrum with associated increased left-sided presacral soft tissue. 4. Aortic atherosclerosis. 5. Infrarenal abdominal aortic aneurysm. Recommend followup by ultrasound in 3 years. This recommendation follows ACR consensus guidelines: White Paper of the ACR Incidental Findings Committee II on Vascular Findings. [employer] Coll Radiol 2013; [phone]  6. Indeterminate soft tissue attenuating filling defect within the proximal colon may represent neoplasm.   Electronically Signed   By: Kerby Moors M.D.   On: 11/05/2014 15:05   Ct Chest Wo Contrast  11/05/2014   CLINICAL DATA:  69 year old with GI bleed and pulmonary embolus.  EXAM: CT CHEST, ABDOMEN AND PELVIS WITHOUT CONTRAST  TECHNIQUE: Multidetector CT imaging of the chest, abdomen and pelvis was performed following the standard protocol without IV contrast.  COMPARISON:  None.  FINDINGS: CT CHEST FINDINGS  Mediastinum: The heart size is normal. No pericardial effusion. Small hiatal hernia noted.  Aortic atherosclerosis is identified. There also calcifications within the LAD and left circumflex coronary artery. Left paratracheal lymph node measures 1.4 cm, image 23/series 2. Sub- carinal lymph node measures 1.1 cm, image 28/series 2.  Lungs/Pleura: Advanced changes of centrilobular and paraseptal emphysema. Numerous pulmonary nodules are identified bilaterally. Index nodule within the right upper lobe measures 3.4 cm, image 17/series 3. In the left upper lobe there is a nodule measuring 2.4 cm, image 22/series 3. Within the right lower lobe there is a 1.9 cm nodule, image 42/series 3.  Musculoskeletal: No aggressive lytic or sclerotic bone lesions. T8 compression fracture is again noted. T2 lesion identified on recent MRI is not well seen.  CT ABDOMEN AND PELVIS FINDINGS  Hepatobiliary: Extensive liver metastases are identified involving both lobes. Large index lesion within the right hepatic lobe measures 6.2 cm, image 51/series 2. Index lesion within the lateral segment of left lobe measures 6.3 cm, image 51/series 2. Index lesion within the inferior right hepatic lobe measures 2.3 cm, image 69/series 2. The gallbladder appears normal. No biliary dilatation.  Pancreas: Masslike enlargement of the head of pancreas measures 3.8 cm, image 68/series 2.  Spleen: There is a low attenuation structure within the central spleen measuring 1.9 cm, image 53/series 2.  Adrenals/Urinary Tract: The adrenal glands are both normal. Bilateral renal cysts are identified. These are incompletely characterized without IV contrast. The urinary bladder is collapsed around a Foley catheter balloon.  Stomach/Bowel: Hiatal hernia noted. The stomach is otherwise unremarkable. The small bowel loops have a normal course and caliber. A 3.3 cm soft tissue attenuating filling defects arise from the lateral wall of the ascending colon, image 87/ series 2. This is of uncertain significance.  Vascular/Lymphatic: Calcified atherosclerotic  disease involves the abdominal aorta. Infrarenal abdominal aortic aneurysm measures 3.1 cm.  Reproductive: Prostate gland appears enlarged. Symmetric appearance of the seminal vesicles.  Other: Increased presacral soft tissue along the left is identified, image 95/series 2 and is worrisome for tumor.  Musculoskeletal: Large mass involving the sacrum is identified. This measures at least 6.3 cm, image 91/series 2. This is better seen on MRI from 11/04/14  IMPRESSION: 1. Extensive pulmonary metastasis and liver metastasis. 2. Mass involving head of pancreas may represent a focus of metastatic disease or primary pancreatic carcinoma. 3. Bone metastasis are better seen on MRI from 11/04/14 an 11/05/2014. There is a large mass involving the central portion of the sacrum with associated increased left-sided presacral soft tissue. 4. Aortic atherosclerosis. 5. Infrarenal abdominal aortic aneurysm. Recommend followup by ultrasound in 3 years. This recommendation follows ACR consensus guidelines: White Paper of the ACR Incidental Findings Committee II on Vascular Findings. J Am Coll Radiol 2013; 63:[id number] 6. Indeterminate soft tissue attenuating filling defect within the proximal colon may represent neoplasm.   Electronically Signed   By: Kerby Moors M.D.   On: 11/05/2014 15:05   Mr Cervical  Spine Wo Contrast  11/05/2014   CLINICAL DATA:  Metastatic disease. Urinary retention and back pain. Lumbar vertebral lesions consistent with metastatic disease.  EXAM: MRI CERVICAL AND THORACIC SPINE WITHOUT CONTRAST  TECHNIQUE: Multiplanar and multiecho pulse sequences of the cervical spine, to include the craniocervical junction and cervicothoracic junction, and thoracic spine, were obtained without intravenous contrast.  COMPARISON:  MRI lumbar spine 11/04/2014  FINDINGS: MRI CERVICAL SPINE FINDINGS  Patient not able to hold still and the images are degraded by extensive motion. This study is marginally diagnostic with limited  information.  Negative for cervical spine fracture. Hemangioma C7 vertebral body. No definite cervical metastatic lesions are identified.  Disc degeneration and spurring is present at C3-4, C4-5, C5-6, and C6-7 causing spinal stenosis. Axial images contain little diagnostic information due to motion.  Abnormal signal T2 vertebral body on the left compatible with metastatic disease. See below report.  Edema is noted in the right occipital lobe worrisome for metastatic disease. MRI of the brain is recommended for further evaluation.  MRI THORACIC SPINE FINDINGS  Image quality limited by motion. Thoracic spine images are better quality than the cervical spine images.  Abnormal signal in the T2 vertebral body in left compatible with metastatic disease. No epidural tumor or cord compression. No other tumor deposits in the thoracic spine. No cord compression  Mild compression fracture T3 appears chronic and benign. Moderate chronic compression fracture of T8.  T9 lesion is hyperintense on T1 and T2 compatible with hemangioma. Small hemangiomata T4 vertebral body on the right and T7 vertebral body on the right.  Spinal cord signal normal.  Bilateral lung nodules are present. Multiple liver lesions are present. Bibasilar atelectasis in the lung bases.  IMPRESSION: Image quality is degraded by significant motion degrading image quality, especially in the cervical spine.  Cervical spondylosis and spinal stenosis. No definite cervical spine metastatic disease or fracture  Edema in the right occipital lobe worrisome for metastatic disease. MRI of the brain with contrast recommended for further evaluation.  Abnormal signal left T2 vertebral body concerning for metastatic disease. No epidural tumor or cord compression.  Mild chronic compression fractures T3 and moderate to severe chronic compression fracture T8.  Bilateral lung nodules. Multiple liver lesions. These findings are compatible with metastatic disease. Bibasilar  atelectasis noted.   Electronically Signed   By: Franchot Gallo M.D.   On: 11/05/2014 09:51   Mr Thoracic Spine Wo Contrast  11/05/2014   CLINICAL DATA:  Metastatic disease. Urinary retention and back pain. Lumbar vertebral lesions consistent with metastatic disease.  EXAM: MRI CERVICAL AND THORACIC SPINE WITHOUT CONTRAST  TECHNIQUE: Multiplanar and multiecho pulse sequences of the cervical spine, to include the craniocervical junction and cervicothoracic junction, and thoracic spine, were obtained without intravenous contrast.  COMPARISON:  MRI lumbar spine 11/04/2014  FINDINGS: MRI CERVICAL SPINE FINDINGS  Patient not able to hold still and the images are degraded by extensive motion. This study is marginally diagnostic with limited information.  Negative for cervical spine fracture. Hemangioma C7 vertebral body. No definite cervical metastatic lesions are identified.  Disc degeneration and spurring is present at C3-4, C4-5, C5-6, and C6-7 causing spinal stenosis. Axial images contain little diagnostic information due to motion.  Abnormal signal T2 vertebral body on the left compatible with metastatic disease. See below report.  Edema is noted in the right occipital lobe worrisome for metastatic disease. MRI of the brain is recommended for further evaluation.  MRI THORACIC SPINE FINDINGS  Image quality limited  by motion. Thoracic spine images are better quality than the cervical spine images.  Abnormal signal in the T2 vertebral body in left compatible with metastatic disease. No epidural tumor or cord compression. No other tumor deposits in the thoracic spine. No cord compression  Mild compression fracture T3 appears chronic and benign. Moderate chronic compression fracture of T8.  T9 lesion is hyperintense on T1 and T2 compatible with hemangioma. Small hemangiomata T4 vertebral body on the right and T7 vertebral body on the right.  Spinal cord signal normal.  Bilateral lung nodules are present. Multiple liver  lesions are present. Bibasilar atelectasis in the lung bases.  IMPRESSION: Image quality is degraded by significant motion degrading image quality, especially in the cervical spine.  Cervical spondylosis and spinal stenosis. No definite cervical spine metastatic disease or fracture  Edema in the right occipital lobe worrisome for metastatic disease. MRI of the brain with contrast recommended for further evaluation.  Abnormal signal left T2 vertebral body concerning for metastatic disease. No epidural tumor or cord compression.  Mild chronic compression fractures T3 and moderate to severe chronic compression fracture T8.  Bilateral lung nodules. Multiple liver lesions. These findings are compatible with metastatic disease. Bibasilar atelectasis noted.   Electronically Signed   By: Franchot Gallo M.D.   On: 11/05/2014 09:51   Mr Lumbar Spine Wo Contrast  11/05/2014   CLINICAL DATA:  LEFT hip pain radiating to LEFT leg for 3 weeks, no injury. History of urinary retention, lower extremity swelling. Assess sciatica.  EXAM: MRI LUMBAR SPINE WITHOUT CONTRAST  TECHNIQUE: Multiplanar, multisequence MR imaging of the lumbar spine was performed. No intravenous contrast was administered.  COMPARISON:  None.  FINDINGS: Using the reference level of the last well-formed intervertebral disc as L5-S1, low T1, bright STIR signal suspicious lesions spans the L5 vertebral body. Abnormal expansile signal throughout the sacrum, with at least 18 mm presacral soft tissue component/tumor. Low signal probable tumor extends into the LEFT neural foramen of the sacrum, partially imaged on the sacrum. No pathologic fracture. The lumbar vertebral bodies are intact and aligned, maintenance of lumbar lordosis. Status post LEFT L4-5 hemilaminectomy. Severe L4-5 disc height loss, moderate at L2-3 with decreased T2 signal within all lumbar disc consistent with moderate desiccation. Moderate chronic discogenic endplate changes H9-6, Q2-2. Acute L3  superior endplate Schmorl's node. Scattered chronic Schmorl's nodes.  Conus medullaris terminates at L1 appears normal in morphology and signal characteristics. Cauda equina is normal in appearance. T2 bright probable cysts in the kidneys bilaterally, incompletely imaged. 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  Level by level evaluation:  T12-L1: No disc bulge, canal stenosis nor neural foraminal narrowing.  L1-2: Annular bulging asymmetric to LEFT. Mild facet arthropathy and ligamentum flavum redundancy without canal stenosis or neural foraminal narrowing.  L2-3: Broad-based disc bulge and central disc protrusion in total measures 6 mm. Mild facet arthropathy and ligamentum flavum redundancy. Moderate canal stenosis. Mild LEFT neural foraminal narrowing.  L3-4: Annular bulging. Mild facet arthropathy and ligamentum flavum redundancy without canal stenosis. Mild to moderate RIGHT, mild LEFT neural foraminal narrowing.  L4-5: Status post LEFT hemilaminectomy. Small broad-based disc bulge, moderate facet arthropathy and RIGHT ligamentum flavum redundancy result in moderate canal stenosis. Moderate to severe RIGHT, mild to moderate LEFT neural foraminal narrowing.  L5-S1: Low signal tumor within the ventral epidural space from lower L5 into the sacrum resulting in mild canal stenosis, effacing the LEFT lateral recess which likely affects the traversing LEFT S1 nerve. Mild RIGHT neural  foraminal narrowing.  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 No pathologic fracture. Tumor extends into the LEFT presacral space, incompletely characterized.  Moderate canal stenosis at L2-3 and L4-5. Status post LEFT L4-5 hemilaminectomy.  Neural foraminal narrowing L2-3 through L5-S1: Moderate to severe on the RIGHT at L4-5.  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 Recommend followup by ultrasound in 3 years. This recommendation follows ACR  consensus guidelines: White Paper of the ACR Incidental Findings Committee II on Vascular Findings. J Am Coll Radiol 2013; 10:789-794   Electronically Signed   By: Elon Alas M.D.   On: 11/05/2014 00:16   Dg Chest Port 1 View  11/04/2014   CLINICAL DATA:  69 year old male with shortness of breath on exertion. No chest pain.  EXAM: PORTABLE CHEST - 1 VIEW  COMPARISON:  Chest x-ray 01/16/2001.  FINDINGS: There are numerous pulmonary nodules and masses throughout the lungs bilaterally, highly concerning for widespread metastatic disease. Largest of these measure up to 4 cm in the right upper lobe. No definite pleural effusions. No evidence of pulmonary edema. Heart size is normal. Upper mediastinal contours are within normal limits. Atherosclerosis in the thoracic aorta.  IMPRESSION: 1. Findings, as above, highly concerning for widespread metastatic disease to the lungs. Further evaluation with contrast enhanced chest CT is recommended at this time. 2. Atherosclerosis.   Electronically Signed   By: Vinnie Langton M.D.   On: 11/04/2014 20:33    Labs:  CBC:  Recent Labs  11/04/14 2205 11/05/14 0135 11/05/14 0522 11/05/14 1155  WBC 11.6* 12.1* 10.1 12.6*  HGB 8.1* 7.6* 7.5* 7.7*  HCT 23.5* 22.0* 21.8* 22.5*  PLT 199 189 209 218    COAGS:  Recent Labs  11/04/14 2037 11/05/14 1011  INR 1.49  --   APTT  --  37    BMP:  Recent Labs  11/04/14 1936 11/05/14 0522  NA 125* 129*  K 3.8 3.9  CL 92* 100*  CO2 19* 18*  GLUCOSE 131* 157*  BUN 39* 30*  CALCIUM 8.1* 7.5*  CREATININE 2.04* 1.57*  GFRNONAA 31* 43*  GFRAA 36* 50*    LIVER FUNCTION TESTS:  Recent Labs  11/05/14 0135 11/05/14 0522  BILITOT 2.2* 2.0*  AST 161* 151*  ALT 85* 80*  ALKPHOS 228* 214*  PROT 5.2* 4.8*  ALBUMIN 2.3* 2.2*    Assessment and Plan: Widely metastatic disease Request for IR consult regarding biopsy Images reviewed with Dr. Pascal Lux and will proceed with US guided liver lesion biopsy  with sedation The patient will be NPO after midnight, no blood thinners taken, labs and vitals have been reviewed. Risks and Benefits discussed with the patient including, but not limited to bleeding, infection, damage to adjacent structures or low yield requiring additional tests. All of the patient's questions were answered, patient is agreeable to proceed. Consent signed and in chart. Anemia of neoplastic disease Heme positive stools s/p EGD esophageal stricture with ulcer s/p dilation-GI following- follow H/H Urinary retention-foley in place   Thank you for this interesting consult.  I greatly enjoyed meeting Stuart Vaughan and look forward to participating in their care.  SignedHedy Jacob 11/05/2014, 4:22 PM   I spent a total of 40 Minutes in face to face in clinical consultation, greater than 50% of which was counseling/coordinating care for widely metastatic disease.

## 2014-11-05 NOTE — Consult Note (Signed)
[place]  [phone]   HEMATOLOGY ONCOLOGY CONSULTATION   Stuart Vaughan  DOB: April 24, 1946  MR#: [id number]  CSN#: [id number]    Requesting Physician: [facility]    Patient Care Team: Maryella Shivers, MD as PCP - General (Family Medicine)  Reason for consult: Metastatic Cancer  HPI  Stuart y.o.  Vaughan prior smoker, admitted on 6/8 with progressive, 2 week history of lower back pain, not amenable to conservative treatment as outpatient. He had been seen at [facility] when he began experiencing urinary retention, requiring a placement of a urine catheter which he eventually removed due to discomfort.  On presentation, his back pain was severe, had developed shortness of breath, chills, hypotension and tachycardia. He had noted stool incontinence as well. MRI of the lumbar spine without contrast remarkable for L5 and sacral mets with tumor in the ventral sacral epidural space which likely affects the traversing LEFT S1 nerve. No pathologic fracture. Tumor extends into the LEFT presacral space. MRI Cervical / Thoracic spine without contrast  showed cervical spondylosis, spinal stenosis, edema in the R occipital lobe, abnormal T2 vertebral body signal all concerning for metastatic disease, chronic compression fractures of T3, T8, bilateral lung nodules, & multiple liver lesions. CT of the chest, abdomen and pelvis are pending. IR to perform a transthoracic needle biopsy of the pulmonary lesion  Of note, CBC was remarkable for a Hb of 8.4, with heme positive stools. Patient had noted hematochezia over the last week. EGD showed esophageal stricture, with ulcer, with biopsy negative for malignancy. No colonoscopy was performed yet, pending on the diagnosis of the transthoracic biopsy. No family history of colon cancer. Never had had a colonoscopy before. Denies risk factors for HIV or hepatitis. Denies Diabetes. Denies ETOH. Denies changes in bowel caliber, He denies a  history of anemia. He denies any vision changes or headaches. He denies any seizures. He reports generalized weakness over the last week. No confusion was reported.   We were kindly requested to see the patient with recommendations.    Past medical history:      Past Medical History  Diagnosis Date  . Hypertension   . High cholesterol   . Sciatica     Past surgical history:      Past Surgical History  Procedure Laterality Date  . Cyst removal from spine    . Status post LEFT L4-5 hemilaminectomy      Medications:  Prior to Admission:  Prescriptions prior to admission  Medication Sig Dispense Refill Last Dose  . bethanechol (URECHOLINE) 50 MG tablet Take 50 mg by mouth 2 (two) times daily.   unknown at unknown  . HYDROcodone-acetaminophen (NORCO/VICODIN) 5-325 MG per tablet Take 1 tablet by mouth every 6 (six) hours as needed for moderate pain.   11/04/2014 at Unknown time  . levofloxacin (LEVAQUIN) 250 MG tablet Take 250 mg by mouth daily.   unknown at Unknown time  . omeprazole (PRILOSEC) 40 MG capsule Take 40 mg by mouth 2 (two) times daily.   Past Week at Unknown time  . pravastatin (PRAVACHOL) 10 MG tablet Take 10 mg by mouth daily.   11/04/2014 at Unknown time  . tamsulosin (FLOMAX) 0.4 MG CAPS capsule Take 0.4 mg by mouth daily after supper.   11/03/2014 at Unknown time  . valsartan-hydrochlorothiazide (DIOVAN-HCT) 320-25 MG per tablet Take 1 tablet by mouth daily.   Past Week at Unknown time    CZY:[id number] **OR** acetaminophen, albuterol, morphine injection, ondansetron **OR**  ondansetron (ZOFRAN) IV  Allergies: No Known Allergies  Family history:     Family History  Problem Relation Age of Onset  . Diabetes Mellitus II Neg Hx   Brother died with kidney cancer                                        Social history: History   Social History  . Marital Status: Married    Spouse Name: N/A  . Number of Children: N/A  . Years of Education: N/A   Occupational  History   Retired Publishing rights manager    Social History Main Topics  . Smoking status: Former Smoker -- 1.00 packs/day for 50 years    Types: Cigarettes    Quit date: 11/05/2011  . Smokeless tobacco: Not on file  . Alcohol Use: No  . Drug Use: No  . Sexual Activity: Not on file   Other Topics Concern  . Not on file   Social History Narrative    ROS: Constitutional: Denies fevers, chills or abnormal night sweats Eyes: Denies blurriness of vision, double vision or watery eyes Ears, nose, mouth, throat, and face: Denies mucositis or sore throat Respiratory: Denies cough, positive for dyspnea and wheezes Cardiovascular: Denies palpitation, chest discomfort or lower extremity swelling Gastrointestinal:  Denies nausea, heartburn or change in bowel habits Skin: Denies abnormal skin rashes Lymphatics: Denies new lymphadenopathy or easy bruising Neurological:Denies numbness, tingling, has been weak over the last week, had stool incontinence. Back pain as above, urinary retention Behavioral/Psych: Mood is stable, no new changes  All other systems were reviewed with the patient and are negative.   Physical Exam    Filed Vitals:   11/05/14 1239  BP: 109/77  Pulse: 103  Temp: 98.6 F (37 C)  Resp: 32   Filed Weights   11/04/14 1915 11/05/14 0100  Weight: 150 lb (68.04 kg) 153 lb (69.4 kg)    GENERAL:alert, no distress, pale SKIN: skin color, texture, turgor are normal, no rashes or significant lesions EYES: normal, conjunctiva are pink and non-injected, sclera clear OROPHARYNX:no exudate, no erythema and lips, buccal mucosa, and tongue normal  NECK: supple, thyroid normal size, non-tender, without nodularity LYMPH:  no palpable lymphadenopathy in the cervical, axillary or inguinal LUNGS: wheezing present, no rhonchi or rales with some breathing effort. Barrel chest noted HEART: regular rate & rhythm and no murmurs and no lower extremity edema ABDOMEN: abdomen soft, non-tender and  normal bowel sounds Musculoskeletal:no cyanosis of digits and no clubbing  PSYCH: alert & oriented x 3 with fluent speech NEURO: no focal motor/sensory deficits   Lab results:       CBC  Recent Labs Lab 11/04/14 1936 11/04/14 2205 11/05/14 0135 11/05/14 0522 11/05/14 1155  WBC 13.5* 11.6* 12.1* 10.1 12.6*  HGB 8.4* 8.1* 7.6* 7.5* 7.7*  HCT 23.8* 23.5* 22.0* 21.8* 22.5*  PLT 248 199 189 209 218  MCV 90.8 91.1 90.9 91.6 92.2  MCH 32.1 31.4 31.4 31.5 31.6  MCHC 35.3 34.5 34.5 34.4 34.2  RDW 13.4 13.5 13.5 13.6 13.6    Anemia panel:  No results for input(s): VITAMINB12, FOLATE, FERRITIN, TIBC, IRON, RETICCTPCT in the last 72 hours.   Chemistries   Recent Labs Lab 11/04/14 1936 11/05/14 0522  NA 125* 129*  K 3.8 3.9  CL 92* 100*  CO2 19* 18*  GLUCOSE 131* 157*  BUN 39* Stuart*  CREATININE 2.04*  1.57*  CALCIUM 8.1* 7.5*     Coagulation profile  Recent Labs Lab 11/04/14 2037  INR 1.49    Urine Studies No results for input(s): UHGB, CRYS in the last 72 hours.  Invalid input(s): UACOL, UAPR, USPG, UPH, UTP, UGL, UKET, UBIL, UNIT, UROB, ULEU, UEPI, UWBC, URBC, UBAC, CAST, UCOM, BILUA  Studies:      Mr Cervical Spine Wo Contrast  11/05/2014   CLINICAL DATA:  Metastatic disease. Urinary retention and back pain. Lumbar vertebral lesions consistent with metastatic disease.  EXAM: MRI CERVICAL AND THORACIC SPINE WITHOUT CONTRAST  TECHNIQUE: Multiplanar and multiecho pulse sequences of the cervical spine, to include the craniocervical junction and cervicothoracic junction, and thoracic spine, were obtained without intravenous contrast.  COMPARISON:  MRI lumbar spine 11/04/2014  FINDINGS: MRI CERVICAL SPINE FINDINGS  Patient not able to hold still and the images are degraded by extensive motion. This study is marginally diagnostic with limited information.  Negative for cervical spine fracture. Hemangioma C7 vertebral body. No definite cervical metastatic lesions are  identified.  Disc degeneration and spurring is present at C3-4, C4-5, C5-6, and C6-7 causing spinal stenosis. Axial images contain little diagnostic information due to motion.  Abnormal signal T2 vertebral body on the left compatible with metastatic disease. See below report.  Edema is noted in the right occipital lobe worrisome for metastatic disease. MRI of the brain is recommended for further evaluation.  MRI THORACIC SPINE FINDINGS  Image quality limited by motion. Thoracic spine images are better quality than the cervical spine images.  Abnormal signal in the T2 vertebral body in left compatible with metastatic disease. No epidural tumor or cord compression. No other tumor deposits in the thoracic spine. No cord compression  Mild compression fracture T3 appears chronic and benign. Moderate chronic compression fracture of T8.  T9 lesion is hyperintense on T1 and T2 compatible with hemangioma. Small hemangiomata T4 vertebral body on the right and T7 vertebral body on the right.  Spinal cord signal normal.  Bilateral lung nodules are present. Multiple liver lesions are present. Bibasilar atelectasis in the lung bases.  IMPRESSION: Image quality is degraded by significant motion degrading image quality, especially in the cervical spine.  Cervical spondylosis and spinal stenosis. No definite cervical spine metastatic disease or fracture  Edema in the right occipital lobe worrisome for metastatic disease. MRI of the brain with contrast recommended for further evaluation.  Abnormal signal left T2 vertebral body concerning for metastatic disease. No epidural tumor or cord compression.  Mild chronic compression fractures T3 and moderate to severe chronic compression fracture T8.  Bilateral lung nodules. Multiple liver lesions. These findings are compatible with metastatic disease. Bibasilar atelectasis noted.   Electronically Signed   By: Franchot Gallo M.D.   On: 11/05/2014 09:51   Mr Thoracic Spine Wo  Contrast  11/05/2014   CLINICAL DATA:  Metastatic disease. Urinary retention and back pain. Lumbar vertebral lesions consistent with metastatic disease.  EXAM: MRI CERVICAL AND THORACIC SPINE WITHOUT CONTRAST  TECHNIQUE: Multiplanar and multiecho pulse sequences of the cervical spine, to include the craniocervical junction and cervicothoracic junction, and thoracic spine, were obtained without intravenous contrast.  COMPARISON:  MRI lumbar spine 11/04/2014  FINDINGS: MRI CERVICAL SPINE FINDINGS  Patient not able to hold still and the images are degraded by extensive motion. This study is marginally diagnostic with limited information.  Negative for cervical spine fracture. Hemangioma C7 vertebral body. No definite cervical metastatic lesions are identified.  Disc degeneration and spurring is  present at C3-4, C4-5, C5-6, and C6-7 causing spinal stenosis. Axial images contain little diagnostic information due to motion.  Abnormal signal T2 vertebral body on the left compatible with metastatic disease. See below report.  Edema is noted in the right occipital lobe worrisome for metastatic disease. MRI of the brain is recommended for further evaluation.  MRI THORACIC SPINE FINDINGS  Image quality limited by motion. Thoracic spine images are better quality than the cervical spine images.  Abnormal signal in the T2 vertebral body in left compatible with metastatic disease. No epidural tumor or cord compression. No other tumor deposits in the thoracic spine. No cord compression  Mild compression fracture T3 appears chronic and benign. Moderate chronic compression fracture of T8.  T9 lesion is hyperintense on T1 and T2 compatible with hemangioma. Small hemangiomata T4 vertebral body on the right and T7 vertebral body on the right.  Spinal cord signal normal.  Bilateral lung nodules are present. Multiple liver lesions are present. Bibasilar atelectasis in the lung bases.  IMPRESSION: Image quality is degraded by significant  motion degrading image quality, especially in the cervical spine.  Cervical spondylosis and spinal stenosis. No definite cervical spine metastatic disease or fracture  Edema in the right occipital lobe worrisome for metastatic disease. MRI of the brain with contrast recommended for further evaluation.  Abnormal signal left T2 vertebral body concerning for metastatic disease. No epidural tumor or cord compression.  Mild chronic compression fractures T3 and moderate to severe chronic compression fracture T8.  Bilateral lung nodules. Multiple liver lesions. These findings are compatible with metastatic disease. Bibasilar atelectasis noted.   Electronically Signed   By: Franchot Gallo M.D.   On: 11/05/2014 09:51   Mr Lumbar Spine Wo Contrast  11/05/2014   CLINICAL DATA:  LEFT hip pain radiating to LEFT leg for 3 weeks, no injury. History of urinary retention, lower extremity swelling. Assess sciatica.  EXAM: MRI LUMBAR SPINE WITHOUT CONTRAST  TECHNIQUE: Multiplanar, multisequence MR imaging of the lumbar spine was performed. No intravenous contrast was administered.  COMPARISON:  None.  FINDINGS: Using the reference level of the last well-formed intervertebral disc as L5-S1, low T1, bright STIR signal suspicious lesions spans the L5 vertebral body. Abnormal expansile signal throughout the sacrum, with at least 18 mm presacral soft tissue component/tumor. Low signal probable tumor extends into the LEFT neural foramen of the sacrum, partially imaged on the sacrum. No pathologic fracture. The lumbar vertebral bodies are intact and aligned, maintenance of lumbar lordosis. Status post LEFT L4-5 hemilaminectomy. Severe L4-5 disc height loss, moderate at L2-3 with decreased T2 signal within all lumbar disc consistent with moderate desiccation. Moderate chronic discogenic endplate changes X5-4, M0-8. Acute L3 superior endplate Schmorl's node. Scattered chronic Schmorl's nodes.  Conus medullaris terminates at L1 appears normal  in morphology and signal characteristics. Cauda equina is normal in appearance. T2 bright probable cysts in the kidneys bilaterally, incompletely imaged. 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  Level by level evaluation:  T12-L1: No disc bulge, canal stenosis nor neural foraminal narrowing.  L1-2: Annular bulging asymmetric to LEFT. Mild facet arthropathy and ligamentum flavum redundancy without canal stenosis or neural foraminal narrowing.  L2-3: Broad-based disc bulge and central disc protrusion in total measures 6 mm. Mild facet arthropathy and ligamentum flavum redundancy. Moderate canal stenosis. Mild LEFT neural foraminal narrowing.  L3-4: Annular bulging. Mild facet arthropathy and ligamentum flavum redundancy without canal stenosis. Mild to moderate RIGHT, mild LEFT neural foraminal narrowing.  L4-5: Status post LEFT  hemilaminectomy. Small broad-based disc bulge, moderate facet arthropathy and RIGHT ligamentum flavum redundancy result in moderate canal stenosis. Moderate to severe RIGHT, mild to moderate LEFT neural foraminal narrowing.  L5-S1: Low signal tumor within the ventral epidural space from lower L5 into the sacrum resulting in mild canal stenosis, effacing the LEFT lateral recess which likely affects the traversing LEFT S1 nerve. Mild RIGHT neural foraminal narrowing.  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 No pathologic fracture. Tumor extends into the LEFT presacral space, incompletely characterized.  Moderate canal stenosis at L2-3 and L4-5. Status post LEFT L4-5 hemilaminectomy.  Neural foraminal narrowing L2-3 through L5-S1: Moderate to severe on the RIGHT at L4-5.  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 Recommend followup by ultrasound in 3 years. This recommendation follows ACR consensus guidelines: White Paper of the ACR Incidental Findings Committee II on Vascular Findings. J Am Coll Radiol  2013; 10:789-794   Electronically Signed   By: Elon Alas M.D.   On: 11/05/2014 00:16   Dg Chest Port 1 View  11/04/2014   CLINICAL DATA:  Stuart year old Vaughan with shortness of breath on exertion. No chest pain.  EXAM: PORTABLE CHEST - 1 VIEW  COMPARISON:  Chest x-ray 01/16/2001.  FINDINGS: There are numerous pulmonary nodules and masses throughout the lungs bilaterally, highly concerning for widespread metastatic disease. Largest of these measure up to 4 cm in the right upper lobe. No definite pleural effusions. No evidence of pulmonary edema. Heart size is normal. Upper mediastinal contours are within normal limits. Atherosclerosis in the thoracic aorta.  IMPRESSION: 1. Findings, as above, highly concerning for widespread metastatic disease to the lungs. Further evaluation with contrast enhanced chest CT is recommended at this time. 2. Atherosclerosis.   Electronically Signed   By: Vinnie Langton M.D.   On: 11/04/2014 20:33    Assessment/Plan:Stuart Vaughan  with   Widely metastatic cancer Of unknown primary MRI of the Lumbar, Cervical and Thoracic Spine show extensive bony metastatic disease.  MRI Cervical / Thoracic spine without contrast showed  edema in the R occipital lobe, abnormal T2 vertebral body signal all concerning for metastatic disease, chronic compression fractures of T3, T8, bilateral lung nodules, & multiple liver lesions Also seen, multiple bilateral pulmonary mets, liver mets Transthoracic  needle biopsy of the pulmonary lesion by IR is planned for diagnosis. Will await for the results.  May need radiation oncology consultation due to urine retention and bowel incontinence, question pathologic compression fractures.  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  Full Code  Rondel Jumbo, PA-C 11/05/2014    Patient seen and examined full note detailed above. Stuart Vaughan currently of [place] where he lives with his wife. He has a history of heavy tobacco use but no other comorbid conditions. He was hospitalized for recurrent back pain as well as urinary retention.  His workup revealed suspicious findings for widely metastatic disease. He has multiple lung nodules, liver metastasis, pancreatic lesion and bony metastasis. He has a sacral lesion possibly causing nerve entrapment. Cervical spine MRI showed possible edema in the brain and his  MRI of the brain is currently pending.  On physical examination, awake alert Vaughan chronically ill-appearing appeared in some mild distress. He is slightly tachycardic and tachypneic. Heart was regular rate and rhythm lungs showed scattered wheezes. His abdomen was distended with good bowel sounds. No rebound or guarding. I cannot appreciate any lower extremity edema.  Laboratory data were reviewed showed increase in his transaminases, renal dysfunction as well as anemia.  Imaging studies were reviewed personally including MRI of the cervical spine as well as lumbar and thoracic spine. CT scan chest abdomen and pelvis was also reviewed. His MRI of the brain is currently pending.  Impression and recommendation:  1. Likely metastatic malignancy of unknown primary. The differential diagnosis was discussed with the patient and his family today. This would include GI malignancy versus lung but could be really any other solid tumor. Biopsy will certainly help clarify and showed some white about the etiology.  The treatment will be certainly dictated by the etiology and the primary of this tumor. Regardless, I feel any treatment would be palliative at  this time given the extensive nature of this cancer as well as the debilitated state he is experiencing.  If outpatient oncology follow-up is needed this can be done at [facility] closer to his home.  Once I have a preliminary results regarding his cancer I'll discuss this further with the patient and his family.  2. 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 I have contacted radiation oncology for a consult in the near future.  3. Anemia: This is related to malignancy versus GI blood loss. I agree with the supportive management as you are doing.  4. Prognosis: The certainly will be dictated by the pathology but I feel his prognosis will be for regardless. Given the extensive nature of his disease, his debilitated state I think he would be a marginal candidate for aggressive therapy.  He understands that regardless of the treatment modality it would be palliative in nature if at all. It would be reasonable to consider palliative medicine involvement for goals of care to help him and his family establish clear realistic goals at this time.  This was discussed with the patient and his family today and their questions were answered. We are awaiting more information and I'll update them as they come.  [facility] MD 11/05/2014

## 2014-11-05 NOTE — Progress Notes (Signed)
Ok to hold blood transfusions until CT and MRI completed per Dr. Sherral Hammers

## 2014-11-05 NOTE — Consult Note (Signed)
Name: Stuart Vaughan MRN: [id number] DOB: Oct 11, 1945    ADMISSION DATE:  11/04/2014 CONSULTATION DATE:  11/05/14  REFERRING MD :  Dr. Sherral Hammers   CHIEF COMPLAINT:  Back Pain   BRIEF PATIENT DESCRIPTION: 69 y/o M, former smoker, admitted to [facility] on 6/8 with a two week history of back pain.  MRI concerning for metastatic disease in lungs, liver, spine and brain.    SIGNIFICANT EVENTS  6/08  Admit with back pain. MRI back concerning for metastatic disease of unclear origin.   6/09  PCCM consulted  STUDIES:  6/09  MRI Cervical / Thoracic Spine >> cervical spondylosis, spinal stenosis, edema in the R occipital lobe worrisome for metastatic disease, abnormal T2 vertebral body signal concerning for metastatic disease, chronic compression fractures of T3, T8, bilateral lung nodules, & multiple liver lesions    HISTORY OF PRESENT ILLNESS:  69 y/o M, former smoker, retired [employer] PMH of HTN, HLD, Sciatica, recent rectal bleeding (using NSAIDS) with EGD at [facility] which showed an esophageal stricture and benign lesion / ulcers who presented to [facility] on 6/8 with a 3 week history of back pain.    The patient was seen at [facility] 2 weeks prior to admit for back pain and urinary retention.  He was treated for sciatica and had a foley catheter placed.  He followed up with Urology as an outpatient but did not tolerate having catheter removed.  The patient did not have improvement in back pain and presented to the [facility] ER for further evaluation.  On arrival, he was noted to be short of breath, hypotensive and tachycardic. FOB was positive per EDP.  He was treated with NS with improvement in blood pressure and HR and O2.  Back pain was further evaluated with an MRI which showed cervical spondylosis, spinal stenosis, edema in the R occipital lobe worrisome for metastatic disease, abnormal T2 vertebral body signal concerning for metastatic disease, chronic compression  fractures of T3, T8, bilateral lung nodules, & multiple liver lesions.  PCCM was consulted for evaluation of lung lesions and possible biopsy.   PAST MEDICAL HISTORY :   has a past medical history of Hypertension; High cholesterol; and Sciatica.  has past surgical history that includes cyst removal from spine and Back surgery.   Prior to Admission medications   Medication Sig Start Date End Date Taking? Authorizing Provider  bethanechol (URECHOLINE) 50 MG tablet Take 50 mg by mouth 2 (two) times daily.   Yes Historical Provider, MD  HYDROcodone-acetaminophen (NORCO/VICODIN) 5-325 MG per tablet Take 1 tablet by mouth every 6 (six) hours as needed for moderate pain.   Yes Historical Provider, MD  levofloxacin (LEVAQUIN) 250 MG tablet Take 250 mg by mouth daily.   Yes Historical Provider, MD  omeprazole (PRILOSEC) 40 MG capsule Take 40 mg by mouth 2 (two) times daily.   Yes Historical Provider, MD  pravastatin (PRAVACHOL) 10 MG tablet Take 10 mg by mouth daily.   Yes Historical Provider, MD  tamsulosin (FLOMAX) 0.4 MG CAPS capsule Take 0.4 mg by mouth daily after supper.   Yes Historical Provider, MD  valsartan-hydrochlorothiazide (DIOVAN-HCT) 320-25 MG per tablet Take 1 tablet by mouth daily.   Yes Historical Provider, MD   No Known Allergies  FAMILY HISTORY:  family history is negative for Diabetes Mellitus II.   SOCIAL HISTORY:  reports that he has quit smoking. He does not have any smokeless tobacco history on file. He reports that he  does not drink alcohol or use illicit drugs.  REVIEW OF SYSTEMS:   Constitutional: Negative for fever, weight loss, malaise/fatigue and diaphoresis. Reports chills HENT: Negative for hearing loss, ear pain, nosebleeds, congestion, sore throat, neck pain, tinnitus and ear discharge.   Eyes: Negative for blurred vision, double vision, photophobia, pain, discharge and redness.  Respiratory: Negative for cough, hemoptysis, sputum production, wheezing and  stridor. Reports shortness of breath  Cardiovascular: Negative for chest pain, palpitations, orthopnea, claudication, leg swelling and PND.  Gastrointestinal: Negative for heartburn, nausea, vomiting, abdominal pain, diarrhea, constipation, blood in stool and melena.  Genitourinary: Negative for dysuria, urgency, frequency, hematuria and flank pain.  Musculoskeletal: Negative for myalgias, joint pain and falls. Reports back pain.  Skin: Negative for itching and rash.  Neurological: Negative for dizziness, tingling, tremors, sensory change, speech change, focal weakness, seizures, loss of consciousness, weakness and headaches.  Endo/Heme/Allergies: Negative for environmental allergies and polydipsia. Does not bruise/bleed easily.  SUBJECTIVE:   VITAL SIGNS: Temp:  [99 F (37.2 C)-102.8 F (39.3 C)] 100.2 F (37.9 C) (06/09 0954) Pulse Rate:  [51-142] 104 (06/09 0954) Resp:  [20-32] 26 (06/09 0954) BP: (81-112)/(46-71) 108/55 mmHg (06/09 0954) SpO2:  [56 %-100 %] 98 % (06/09 0954) FiO2 (%):  [0 %] 0 % (06/09 0106) Weight:  [150 lb (68.04 kg)-153 lb (69.4 kg)] 153 lb (69.4 kg) (06/09 0100)  PHYSICAL EXAMINATION: General:  wdwn elderly male in NAD Neuro:  AAOx4, speech clear, MAE  HEENT:  MM pink/moist, no jvd Cardiovascular:  s1s2 rrr, no m/r/g Lungs:  Tachypnea, mildly labored, able to speak full sentences.  Lungs bilaterally with faint exp wheeze  Abdomen:  NTND, bsx4 active  Musculoskeletal:  No acute deformities  Skin:  Warm/dry, trace LE edema    Recent Labs Lab 11/04/14 1936 11/05/14 0522  NA 125* 129*  K 3.8 3.9  CL 92* 100*  CO2 19* 18*  BUN 39* 30*  CREATININE 2.04* 1.57*  GLUCOSE 131* 157*    Recent Labs Lab 11/04/14 2205 11/05/14 0135 11/05/14 0522  HGB 8.1* 7.6* 7.5*  HCT 23.5* 22.0* 21.8*  WBC 11.6* 12.1* 10.1  PLT 199 189 209   Mr Cervical Spine Wo Contrast  11/05/2014   CLINICAL DATA:  Metastatic disease. Urinary retention and back pain. Lumbar  vertebral lesions consistent with metastatic disease.  EXAM: MRI CERVICAL AND THORACIC SPINE WITHOUT CONTRAST  TECHNIQUE: Multiplanar and multiecho pulse sequences of the cervical spine, to include the craniocervical junction and cervicothoracic junction, and thoracic spine, were obtained without intravenous contrast.  COMPARISON:  MRI lumbar spine 11/04/2014  FINDINGS: MRI CERVICAL SPINE FINDINGS  Patient not able to hold still and the images are degraded by extensive motion. This study is marginally diagnostic with limited information.  Negative for cervical spine fracture. Hemangioma C7 vertebral body. No definite cervical metastatic lesions are identified.  Disc degeneration and spurring is present at C3-4, C4-5, C5-6, and C6-7 causing spinal stenosis. Axial images contain little diagnostic information due to motion.  Abnormal signal T2 vertebral body on the left compatible with metastatic disease. See below report.  Edema is noted in the right occipital lobe worrisome for metastatic disease. MRI of the brain is recommended for further evaluation.  MRI THORACIC SPINE FINDINGS  Image quality limited by motion. Thoracic spine images are better quality than the cervical spine images.  Abnormal signal in the T2 vertebral body in left compatible with metastatic disease. No epidural tumor or cord compression. No other tumor deposits in the  thoracic spine. No cord compression  Mild compression fracture T3 appears chronic and benign. Moderate chronic compression fracture of T8.  T9 lesion is hyperintense on T1 and T2 compatible with hemangioma. Small hemangiomata T4 vertebral body on the right and T7 vertebral body on the right.  Spinal cord signal normal.  Bilateral lung nodules are present. Multiple liver lesions are present. Bibasilar atelectasis in the lung bases.  IMPRESSION: Image quality is degraded by significant motion degrading image quality, especially in the cervical spine.  Cervical spondylosis and spinal  stenosis. No definite cervical spine metastatic disease or fracture  Edema in the right occipital lobe worrisome for metastatic disease. MRI of the brain with contrast recommended for further evaluation.  Abnormal signal left T2 vertebral body concerning for metastatic disease. No epidural tumor or cord compression.  Mild chronic compression fractures T3 and moderate to severe chronic compression fracture T8.  Bilateral lung nodules. Multiple liver lesions. These findings are compatible with metastatic disease. Bibasilar atelectasis noted.   Electronically Signed   By: Franchot Gallo M.D.   On: 11/05/2014 09:51   Mr Thoracic Spine Wo Contrast  11/05/2014   CLINICAL DATA:  Metastatic disease. Urinary retention and back pain. Lumbar vertebral lesions consistent with metastatic disease.  EXAM: MRI CERVICAL AND THORACIC SPINE WITHOUT CONTRAST  TECHNIQUE: Multiplanar and multiecho pulse sequences of the cervical spine, to include the craniocervical junction and cervicothoracic junction, and thoracic spine, were obtained without intravenous contrast.  COMPARISON:  MRI lumbar spine 11/04/2014  FINDINGS: MRI CERVICAL SPINE FINDINGS  Patient not able to hold still and the images are degraded by extensive motion. This study is marginally diagnostic with limited information.  Negative for cervical spine fracture. Hemangioma C7 vertebral body. No definite cervical metastatic lesions are identified.  Disc degeneration and spurring is present at C3-4, C4-5, C5-6, and C6-7 causing spinal stenosis. Axial images contain little diagnostic information due to motion.  Abnormal signal T2 vertebral body on the left compatible with metastatic disease. See below report.  Edema is noted in the right occipital lobe worrisome for metastatic disease. MRI of the brain is recommended for further evaluation.  MRI THORACIC SPINE FINDINGS  Image quality limited by motion. Thoracic spine images are better quality than the cervical spine images.   Abnormal signal in the T2 vertebral body in left compatible with metastatic disease. No epidural tumor or cord compression. No other tumor deposits in the thoracic spine. No cord compression  Mild compression fracture T3 appears chronic and benign. Moderate chronic compression fracture of T8.  T9 lesion is hyperintense on T1 and T2 compatible with hemangioma. Small hemangiomata T4 vertebral body on the right and T7 vertebral body on the right.  Spinal cord signal normal.  Bilateral lung nodules are present. Multiple liver lesions are present. Bibasilar atelectasis in the lung bases.  IMPRESSION: Image quality is degraded by significant motion degrading image quality, especially in the cervical spine.  Cervical spondylosis and spinal stenosis. No definite cervical spine metastatic disease or fracture  Edema in the right occipital lobe worrisome for metastatic disease. MRI of the brain with contrast recommended for further evaluation.  Abnormal signal left T2 vertebral body concerning for metastatic disease. No epidural tumor or cord compression.  Mild chronic compression fractures T3 and moderate to severe chronic compression fracture T8.  Bilateral lung nodules. Multiple liver lesions. These findings are compatible with metastatic disease. Bibasilar atelectasis noted.   Electronically Signed   By: Franchot Gallo M.D.   On: 11/05/2014 09:51  Mr Lumbar Spine Wo Contrast  11/05/2014   CLINICAL DATA:  LEFT hip pain radiating to LEFT leg for 3 weeks, no injury. History of urinary retention, lower extremity swelling. Assess sciatica.  EXAM: MRI LUMBAR SPINE WITHOUT CONTRAST  TECHNIQUE: Multiplanar, multisequence MR imaging of the lumbar spine was performed. No intravenous contrast was administered.  COMPARISON:  None.  FINDINGS: Using the reference level of the last well-formed intervertebral disc as L5-S1, low T1, bright STIR signal suspicious lesions spans the L5 vertebral body. Abnormal expansile signal throughout  the sacrum, with at least 18 mm presacral soft tissue component/tumor. Low signal probable tumor extends into the LEFT neural foramen of the sacrum, partially imaged on the sacrum. No pathologic fracture. The lumbar vertebral bodies are intact and aligned, maintenance of lumbar lordosis. Status post LEFT L4-5 hemilaminectomy. Severe L4-5 disc height loss, moderate at L2-3 with decreased T2 signal within all lumbar disc consistent with moderate desiccation. Moderate chronic discogenic endplate changes I4-3, P2-9. Acute L3 superior endplate Schmorl's node. Scattered chronic Schmorl's nodes.  Conus medullaris terminates at L1 appears normal in morphology and signal characteristics. Cauda equina is normal in appearance. T2 bright probable cysts in the kidneys bilaterally, incompletely imaged. 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  Level by level evaluation:  T12-L1: No disc bulge, canal stenosis nor neural foraminal narrowing.  L1-2: Annular bulging asymmetric to LEFT. Mild facet arthropathy and ligamentum flavum redundancy without canal stenosis or neural foraminal narrowing.  L2-3: Broad-based disc bulge and central disc protrusion in total measures 6 mm. Mild facet arthropathy and ligamentum flavum redundancy. Moderate canal stenosis. Mild LEFT neural foraminal narrowing.  L3-4: Annular bulging. Mild facet arthropathy and ligamentum flavum redundancy without canal stenosis. Mild to moderate RIGHT, mild LEFT neural foraminal narrowing.  L4-5: Status post LEFT hemilaminectomy. Small broad-based disc bulge, moderate facet arthropathy and RIGHT ligamentum flavum redundancy result in moderate canal stenosis. Moderate to severe RIGHT, mild to moderate LEFT neural foraminal narrowing.  L5-S1: Low signal tumor within the ventral epidural space from lower L5 into the sacrum resulting in mild canal stenosis, effacing the LEFT lateral recess which likely affects the traversing LEFT S1 nerve. Mild RIGHT neural foraminal  narrowing.  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 No pathologic fracture. Tumor extends into the LEFT presacral space, incompletely characterized.  Moderate canal stenosis at L2-3 and L4-5. Status post LEFT L4-5 hemilaminectomy.  Neural foraminal narrowing L2-3 through L5-S1: Moderate to severe on the RIGHT at L4-5.  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 Recommend followup by ultrasound in 3 years. This recommendation follows ACR consensus guidelines: White Paper of the ACR Incidental Findings Committee II on Vascular Findings. J Am Coll Radiol 2013; 10:789-794   Electronically Signed   By: Elon Alas M.D.   On: 11/05/2014 00:16   Dg Chest Port 1 View  11/04/2014   CLINICAL DATA:  69 year old male with shortness of breath on exertion. No chest pain.  EXAM: PORTABLE CHEST - 1 VIEW  COMPARISON:  Chest x-ray 01/16/2001.  FINDINGS: There are numerous pulmonary nodules and masses throughout the lungs bilaterally, highly concerning for widespread metastatic disease. Largest of these measure up to 4 cm in the right upper lobe. No definite pleural effusions. No evidence of pulmonary edema. Heart size is normal. Upper mediastinal contours are within normal limits. Atherosclerosis in the thoracic aorta.  IMPRESSION: 1. Findings, as above, highly concerning for widespread metastatic disease to the  lungs. Further evaluation with contrast enhanced chest CT is recommended at this time. 2. Atherosclerosis.   Electronically Signed   By: Vinnie Langton M.D.   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 Bronchoscopy would have a reasonable diagnostic yield although none of the pulmonary  lesions appear to be endobronchial. However, several of the lesions abut the pleura and are large enough that a transthoracic approach would have a very high yield and acceptable risk.   2) Very severe emphysema by CT chest  3) Fever without clear infection. I would not characterize this as sepsis  PLAN: CT chest, abd ordered by [place] and has been reviewed by me - official reading pending We have called Oncology consultation with goal of initiating therapy as soon as possible once dx is confirmed by bx I have called IR and placed order for transthoracic needle biopsy of one of the pulmonary lesions Agree with empiric bronchodilators and O2 Would consider DC of abx or short course to minimize risk of adverse consequences of abx therapy  Seen in conjunction with NP Reece Agar, MD ; [facility] [place] [phone].  After 5:30 PM or weekends, call [phone] 11/05/2014, 10:20 AM

## 2014-11-05 NOTE — Progress Notes (Signed)
  Echocardiogram 2D Echocardiogram has been performed.  Stuart Vaughan 11/05/2014, 10:04 AM

## 2014-11-05 NOTE — Progress Notes (Signed)
[facility] TEAM 1 - Stepdown/ICU TEAM Progress Note  LENOARD HELBERT ACZ:[id number] DOB: 10/20/1945 DOA: 11/04/2014 PCP: Maryella Shivers, MD  Admit HPI / Brief Narrative: 69 year old WM PMHx hypertension and hyperlipidemia   Has been experiencing increasing low back pain over the last several weeks. He was diagnosed with sciatica a few weeks ago. He also went to the emergency room because of urinary retention and had a Foley catheter placed. He went back to see the urologist yesterday who took the catheter out but patient still could not urinate and saw the catheter had to be replaced. The patient's wife stated that they said they thought the urinary retention was from the sciatica area he came to our emergency room last night because of shortness of breath and he was found to be somewhat hypotensive and tachycardic. He was found to have a hemoglobin of around 8. He was found to have heme positive stool. A chest x-ray showed metastatic disease. An MRI of the spine shows lesion in the lumbar region. The patient had an EGD with esophageal dilation of an esophageal stricture up to 30 [place] on Monday of this week which was 4 days ago. He also had an esophageal ulcer which was biopsied and the biopsy was negative. He was found to have heme positive stool in the ER here but no melena. He is never had a colonoscopy.  HPI/Subjective: 6/9 patient former smoker states never placed on home O2. States upon admission his lower back pain was 10/10 positive large from the weakness, positive urine retention; all symptoms occurred acutely (over the last 48-72 hours). States currently pain free, negative SOB, negative CP.  Assessment/Plan: Acute respiratory failure with hypoxia/COPD/CAP  -Most likely multifactorial to include COPD from previous history of smoking, metastatic cancer unknown primary, CAP?, PE?. -Although patient never officially diagnosed with COPD, was former heavy smoker (50+-pack-year) and findings  consistent with COPD - DuoNeb QID  -Albuterol PRN  -Continue empiric antibiotics  -D-dimer pending; unsure of patient's current state this is helpful most likely will be positive secondary to significant inflammation caused by metastatic cancer. Would not be able to treat PE secondary patient's G I bleed. -CT abdomen and pelvis without contrast pending -CT chest without contrast pending -PC CM to consult; bronchoscopy? To determine primary cancer.  Metastatic cancer unknown primary -See above  Acute GI bleed -GI has evaluated patient and feels bleeding most likely consistent with recent EGD with dilation and esophageal ulcer diagnosed 4 days ago. Do not believe further workup warranted. -C. difficile pending -Transfuse 2 units PRBC -See above -Transfuse for hemoglobin<7  Low back pain with radiation to the left lower extremity  -Secondary to metastatic cancer unknown origin in the L-spine area with nerve root impingement and invasion into the pre-sacral  space see MRI results below -Decadron 10 mg 1; then 4 mg QID  Acute blood loss anemia  - baseline hemoglobin is not known. Closely follow CBC.  Acute renal failure with urinary retention  - follow CT abdomen and pelvis for any urinary tract obstruction or hydronephrosis. -Continue Foley obstruction vs secondary to nerve impingement secondary Metastatic lesions      Code Status: FULL Family Communication: Wife present at time of exam Disposition Plan: Diagnosis of primary cancer    Consultants: PCCM   Procedure/Significant Events: 6/8 PCXR review of film shows patient with multiple bilateral large well-circumscribed nodules; metastatic carcinoma? 6/9 MRI L-spine without contrast; -L5 and sacral metastasis with tumor in the ventral sacral epidural space; inserting for  nerve root impingement LEFT S1 nerve. -Tumor extends into the LEFT presacral space, -Moderate canal stenosis at L2-3 and L4-5. Status post LEFT L4-5  hemilaminectomy. -Neural foraminal narrowing L2-3 through L5-S1: Moderate to severe on the RIGHT at L4-5. 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 -Left T2 vertebral body concerning for metastatic disease.  -Mild chronic compression fractures T3 -moderate to severe chronic compression fracture T8. -Bilateral lung nodules. Multiple liver lesions, C/W metastatic disease.  6/9Transfuse 2 units PRBC   Culture 6/9 MRSA by PCR negative   Antibiotics: Zosyn 6/8>> Vancomycin 6/8>>  DVT prophylaxis: SCD (GI bleed)   Devices    LINES / TUBES:      Continuous Infusions: . sodium chloride 100 mL/hr at 11/05/14 0142  . pantoprozole (PROTONIX) infusion 8 mg/hr (11/05/14 0248)    Objective: VITAL SIGNS: Temp: 98.2 F (36.8 C) (06/09 1800) Temp Source: Oral (06/09 1741) BP: 99/64 mmHg (06/09 1741) Pulse Rate: 100 (06/09 1641) SPO2; FIO2:   Intake/Output Summary (Last 24 hours) at 11/05/14 1950 Last data filed at 11/05/14 1741  Gross per 24 hour  Intake 1310.42 ml  Output   1600 ml  Net -289.58 ml     Exam: General: A/O 4, No acute respiratory distress (patient's SPO2 dips into the mid 80s) Eyes: Negative headache, eye pain, double vision, retinal hemorrhage ENT: Negative Runny nose, negative ear pain, negative tinnitus, negative gingival bleeding,  Neck:  Negative scars, masses, torticollis, lymphadenopathy (supra clavicular lymph node?), Negative JVD Lungs: diffuse expiratory wheezing, negative crackles Cardiovascular: Tachycardic, Regular rhythm without murmur gallop or rub normal S1 and S2 Abdomen:negative abdominal pain, negative dysphagia, Nontender, nondistended, soft, bowel sounds positive, no rebound, no ascites, no appreciable mass Extremities: No significant cyanosis, clubbing, or edema bilateral lower extremities Psychiatric:  Negative depression, negative anxiety,  negative fatigue, negative mania  Neurologic:  Cranial nerves II through XII intact, tongue/uvula midline, all extremities muscle strength 5/5, sensation intact throughout,  negative dysarthria, negative expressive aphasia, negative receptive aphasia.      Data Reviewed: Basic Metabolic Panel:  Recent Labs Lab 11/04/14 1936 11/05/14 0522  NA 125* 129*  K 3.8 3.9  CL 92* 100*  CO2 19* 18*  GLUCOSE 131* 157*  BUN 39* 30*  CREATININE 2.04* 1.57*  CALCIUM 8.1* 7.5*   Liver Function Tests:  Recent Labs Lab 11/05/14 0135 11/05/14 0522  AST 161* 151*  ALT 85* 80*  ALKPHOS 228* 214*  BILITOT 2.2* 2.0*  PROT 5.2* 4.8*  ALBUMIN 2.3* 2.2*   No results for input(s): LIPASE, AMYLASE in the last 168 hours. No results for input(s): AMMONIA in the last 168 hours. CBC:  Recent Labs Lab 11/04/14 1936 11/04/14 2205 11/05/14 0135 11/05/14 0522 11/05/14 1155  WBC 13.5* 11.6* 12.1* 10.1 12.6*  HGB 8.4* 8.1* 7.6* 7.5* 7.7*  HCT 23.8* 23.5* 22.0* 21.8* 22.5*  MCV 90.8 91.1 90.9 91.6 92.2  PLT 248 199 189 209 218   Cardiac Enzymes:  Recent Labs Lab 11/05/14 0135 11/05/14 1011 11/05/14 1655  TROPONINI <0.03 <0.03 <0.03   BNP (last 3 results)  Recent Labs  11/04/14 1936 11/05/14 0135  BNP 63.3 56.4    ProBNP (last 3 results) No results for input(s): PROBNP in the last 8760 hours.  CBG:  Recent Labs Lab 11/05/14 0430 11/05/14 1238  GLUCAP 183* 140*    Recent Results (from the past 240 hour(s))  MRSA PCR Screening     Status: None  Collection Time: 11/05/14  1:05 AM  Result Value Ref Range Status   MRSA by PCR NEGATIVE NEGATIVE Final    Comment:        The GeneXpert MRSA Assay (FDA approved for NASAL specimens only), is one component of a comprehensive MRSA colonization surveillance program. It is not intended to diagnose MRSA infection nor to guide or monitor treatment for MRSA infections.   Clostridium Difficile by PCR (not at [facility])     Status:  None   Collection Time: 11/05/14 11:01 AM  Result Value Ref Range Status   C difficile by pcr NEGATIVE NEGATIVE Final     Studies:  Recent x-ray studies have been reviewed in detail by the Attending Physician  Scheduled Meds:  Scheduled Meds: . sodium chloride   Intravenous Once  . sodium chloride   Intravenous Once  . dexamethasone  4 mg Intravenous 6 times per day  . dextromethorphan-guaiFENesin  1 tablet Oral BID  . ipratropium-albuterol  3 mL Nebulization Q6H  . [START ON 11/08/2014] pantoprazole (PROTONIX) IV  40 mg Intravenous Q12H  . piperacillin-tazobactam (ZOSYN)  IV  3.375 g Intravenous Q8H  . vancomycin  1,000 mg Intravenous Q24H    Time spent on care of this patient: 40 mins   WOODS, Geraldo Docker , MD  [facility] Office  [phone] Pager - [phone]  On-Call/Text Page:      [email or web address]      password [email or web address]  If 7PM-7AM, please contact night-coverage [email or web address] Password [email or web address] 11/05/2014, 7:50 PM   LOS: 1 day   Care during the described time interval was provided by me .  I have reviewed this patient's available data, including medical history, events of note, physical examination, and all test results as part of my evaluation. I have personally reviewed and interpreted all radiology studies.   Dia Crawford, MD [phone] Pager

## 2014-11-06 ENCOUNTER — Inpatient Hospital Stay (HOSPITAL_COMMUNITY): Payer: Medicare Other

## 2014-11-06 DIAGNOSIS — I890 Lymphedema, not elsewhere classified: Secondary | ICD-10-CM

## 2014-11-06 DIAGNOSIS — C7931 Secondary malignant neoplasm of brain: Secondary | ICD-10-CM

## 2014-11-06 LAB — PROCALCITONIN: PROCALCITONIN: 0.92 ng/mL

## 2014-11-06 LAB — COMPREHENSIVE METABOLIC PANEL
ALBUMIN: 2 g/dL — AB (ref 3.5–5.0)
ALT: 80 U/L — ABNORMAL HIGH (ref 17–63)
ANION GAP: 10 (ref 5–15)
AST: 133 U/L — ABNORMAL HIGH (ref 15–41)
Alkaline Phosphatase: 265 U/L — ABNORMAL HIGH (ref 38–126)
BILIRUBIN TOTAL: 2.7 mg/dL — AB (ref 0.3–1.2)
BUN: 23 mg/dL — AB (ref 6–20)
CO2: 18 mmol/L — ABNORMAL LOW (ref 22–32)
CREATININE: 1.37 mg/dL — AB (ref 0.61–1.24)
Calcium: 7.6 mg/dL — ABNORMAL LOW (ref 8.9–10.3)
Chloride: 100 mmol/L — ABNORMAL LOW (ref 101–111)
GFR calc Af Amer: 59 mL/min — ABNORMAL LOW (ref >60)
GFR calc non Af Amer: 51 mL/min — ABNORMAL LOW (ref >60)
Glucose, Bld: 394 mg/dL — ABNORMAL HIGH (ref 65–99)
Potassium: 3.9 mmol/L (ref 3.5–5.1)
Sodium: 128 mmol/L — ABNORMAL LOW (ref 135–145)
Total Protein: 4.9 g/dL — ABNORMAL LOW (ref 6.5–8.1)

## 2014-11-06 LAB — CBC WITH DIFFERENTIAL/PLATELET
BASOS PCT: 0 % (ref 0–1)
Basophils Absolute: 0 10*3/uL (ref 0.0–0.1)
Eosinophils Absolute: 0 10*3/uL (ref 0.0–0.7)
Eosinophils Relative: 0 % (ref 0–5)
HCT: 27.7 % — ABNORMAL LOW (ref 39.0–52.0)
Hemoglobin: 9.4 g/dL — ABNORMAL LOW (ref 13.0–17.0)
LYMPHS ABS: 0.5 10*3/uL — AB (ref 0.7–4.0)
Lymphocytes Relative: 4 % — ABNORMAL LOW (ref 12–46)
MCH: 30 pg (ref 26.0–34.0)
MCHC: 33.9 g/dL (ref 30.0–36.0)
MCV: 88.5 fL (ref 78.0–100.0)
Monocytes Absolute: 0.5 10*3/uL (ref 0.1–1.0)
Monocytes Relative: 5 % (ref 3–12)
NEUTROS PCT: 91 % — AB (ref 43–77)
Neutro Abs: 9.7 10*3/uL — ABNORMAL HIGH (ref 1.7–7.7)
PLATELETS: 216 10*3/uL (ref 150–400)
RBC: 3.13 MIL/uL — AB (ref 4.22–5.81)
RDW: 14.3 % (ref 11.5–15.5)
WBC: 10.7 10*3/uL — ABNORMAL HIGH (ref 4.0–10.5)

## 2014-11-06 LAB — MAGNESIUM: MAGNESIUM: 1.8 mg/dL (ref 1.7–2.4)

## 2014-11-06 LAB — GLUCOSE, CAPILLARY
GLUCOSE-CAPILLARY: 345 mg/dL — AB (ref 65–99)
GLUCOSE-CAPILLARY: 351 mg/dL — AB (ref 65–99)
GLUCOSE-CAPILLARY: 433 mg/dL — AB (ref 65–99)
Glucose-Capillary: 381 mg/dL — ABNORMAL HIGH (ref 65–99)
Glucose-Capillary: 412 mg/dL — ABNORMAL HIGH (ref 65–99)
Glucose-Capillary: 326 mg/dL — ABNORMAL HIGH (ref 65–99)
Glucose-Capillary: 382 mg/dL — ABNORMAL HIGH (ref 65–99)
Glucose-Capillary: 401 mg/dL — ABNORMAL HIGH (ref 65–99)

## 2014-11-06 LAB — TYPE AND SCREEN
ABO/RH(D): O POS
ANTIBODY SCREEN: NEGATIVE
Unit division: 0
Unit division: 0

## 2014-11-06 MED ORDER — MIDAZOLAM HCL 2 MG/2ML IJ SOLN
INTRAMUSCULAR | Status: AC | PRN
  Administered 2014-11-06: 1 mg via INTRAVENOUS

## 2014-11-06 MED ORDER — SODIUM CHLORIDE 0.9 % IV BOLUS (SEPSIS)
500.0000 mL | Freq: Once | INTRAVENOUS | Status: AC
  Administered 2014-11-06: 500 mL via INTRAVENOUS

## 2014-11-06 MED ORDER — MIDAZOLAM HCL 2 MG/2ML IJ SOLN
INTRAMUSCULAR | Status: AC
  Filled 2014-11-06: qty 2

## 2014-11-06 MED ORDER — ACETAMINOPHEN 325 MG PO TABS: 650.0000 mg | ORAL_TABLET | Freq: Four times a day (QID) | ORAL | Status: DC | PRN

## 2014-11-06 MED ORDER — FENTANYL CITRATE (PF) 100 MCG/2ML IJ SOLN
INTRAMUSCULAR | Status: AC
  Filled 2014-11-06: qty 2

## 2014-11-06 MED ORDER — GADOBENATE DIMEGLUMINE 529 MG/ML IV SOLN
15.0000 mL | Freq: Once | INTRAVENOUS | Status: AC
  Administered 2014-11-06: 13 mL via INTRAVENOUS

## 2014-11-06 MED ORDER — MORPHINE SULFATE 2 MG/ML IJ SOLN
2.0000 mg | INTRAMUSCULAR | Status: DC | PRN
  Administered 2014-11-07 – 2014-11-10 (×4): 4 mg via INTRAVENOUS
  Filled 2014-11-06 (×5): qty 2

## 2014-11-06 MED ORDER — FENTANYL CITRATE (PF) 100 MCG/2ML IJ SOLN
INTRAMUSCULAR | Status: AC | PRN
  Administered 2014-11-06: 50 ug via INTRAVENOUS

## 2014-11-06 MED ORDER — OXYCODONE HCL 5 MG PO TABS
5.0000 mg | ORAL_TABLET | ORAL | Status: DC | PRN
  Administered 2014-11-06 (×2): 5 mg via ORAL
  Administered 2014-11-07 – 2014-11-08 (×3): 10 mg via ORAL
  Filled 2014-11-06 (×3): qty 2
  Filled 2014-11-06 (×2): qty 1

## 2014-11-06 MED ORDER — LIDOCAINE HCL (PF) 1 % IJ SOLN
INTRAMUSCULAR | Status: AC
  Filled 2014-11-06: qty 10

## 2014-11-06 MED ORDER — PANTOPRAZOLE SODIUM 40 MG PO TBEC
40.0000 mg | DELAYED_RELEASE_TABLET | Freq: Two times a day (BID) | ORAL | Status: DC
  Administered 2014-11-06 – 2014-11-12 (×12): 40 mg via ORAL
  Filled 2014-11-06 (×12): qty 1

## 2014-11-06 MED ORDER — ACETAMINOPHEN-CODEINE #3 300-30 MG PO TABS: 1.0000 | ORAL_TABLET | ORAL | Status: DC | PRN

## 2014-11-06 MED ORDER — INSULIN ASPART 100 UNIT/ML ~~LOC~~ SOLN
0.0000 [IU] | SUBCUTANEOUS | Status: DC
  Administered 2014-11-06 (×3): 9 [IU] via SUBCUTANEOUS

## 2014-11-06 MED ORDER — INSULIN GLARGINE 100 UNIT/ML ~~LOC~~ SOLN
14.0000 [IU] | Freq: Every day | SUBCUTANEOUS | Status: DC
  Administered 2014-11-06 – 2014-11-07 (×2): 14 [IU] via SUBCUTANEOUS
  Filled 2014-11-06 (×3): qty 0.14

## 2014-11-06 MED ORDER — GELATIN ABSORBABLE 12-7 MM EX MISC
CUTANEOUS | Status: AC
  Filled 2014-11-06: qty 1

## 2014-11-06 NOTE — Progress Notes (Signed)
Patient SBP in the 80, MAP in the 55-65 range, neuro intact, in NSR, paged NP Lynch.

## 2014-11-06 NOTE — Progress Notes (Signed)
With findings of widespread metastatic disease, and liver biopsy having been performed today, and oncology on board, I don't think that we have very much more to offer from a GI standpoint and therefore we will sign off at this time. If you need [place] for anything however call us.

## 2014-11-06 NOTE — Progress Notes (Signed)
Utilization Review Completed.  

## 2014-11-06 NOTE — Progress Notes (Signed)
IP PROGRESS NOTE  Subjective:   Events overnight noted. Patient feels about the same. No neurological symptoms. No headaches, dizziness or seizures.  He had liver bx done without complications this am.   Objective:  Vital signs in last 24 hours: Temp:  [97.6 F (36.4 C)-99.1 F (37.3 C)] 98.1 F (36.7 C) (06/10 1123) Pulse Rate:  [64-103] 75 (06/10 1123) Resp:  [15-32] 18 (06/10 1123) BP: (90-127)/(50-86) 112/73 mmHg (06/10 1123) SpO2:  [92 %-100 %] 96 % (06/10 1123) Weight:  [149 lb 7.6 oz (67.8 kg)] 149 lb 7.6 oz (67.8 kg) (06/10 0540) Weight change: -8.5 oz (-0.24 kg) Last BM Date: 11/05/14  Intake/Output from previous day: 06/09 0701 - 06/10 0700 In: 1482.5 [I.V.:212.5; Blood:970; IV Piggyback:300] Out: 2050 [Urine:2050]  Mouth: mucous membranes moist, pharynx normal without lesions Resp: wheezes bilaterally Cardio: regular rate and rhythm, S1, S2 normal, no murmur, click, rub or gallop GI: soft, non-tender; bowel sounds normal; no masses,  no organomegaly Extremities: extremities normal, atraumatic, no cyanosis or edema    Lab Results:  Recent Labs  11/05/14 1155 11/06/14 0232  WBC 12.6* 10.7*  HGB 7.7* 9.4*  HCT 22.5* 27.7*  PLT 218 216    BMET  Recent Labs  11/05/14 0522 11/06/14 0232  NA 129* 128*  K 3.9 3.9  CL 100* 100*  CO2 18* 18*  GLUCOSE 157* 394*  BUN 30* 23*  CREATININE 1.57* 1.37*  CALCIUM 7.5* 7.6*    Studies/Results:    Mr Kizzie Fantasia Contrast  11/06/2014   CLINICAL DATA:  69 year old male with recent diagnosis of extensive pulmonary, liver, and bone metastatic disease. Possible brain metastasis on recent cervical spine MRI.  EXAM: MRI HEAD WITHOUT AND WITH CONTRAST  TECHNIQUE: Multiplanar, multiecho pulse sequences of the brain and surrounding structures were obtained without and with intravenous contrast.  CONTRAST:  82mL MULTIHANCE GADOBENATE DIMEGLUMINE 529 MG/ML IV SOLN - INFILTRATED  COMPARISON:  Cervical spine MRI 11/05/2014.   FINDINGS: The patient's intravenous access infiltrated during contrast administration such that no post-contrast imaging of the brain could be obtained. The patient was asymptomatic.  These images demonstrate abnormal T2 and FLAIR hyperintensity in the posterior right temporal lobe encompassing an area of 3 cm (series 6, image 10) with underlying 9-10 mm heterogeneously decreased T2 signal rounded masslike area (series 5, images 9 and 10). Minimal regional mass effect. There is associated hemorrhage (Series 7, image 8) with mild intrinsic T1 and decreased T2 * signal.  No other vasogenic edema or blood products identified in the brain. Scattered small and patchy nonspecific bilateral cerebral white matter T2 and FLAIR hyperintensity. No midline shift. No ventriculomegaly. No restricted diffusion or evidence of acute infarction. Major intracranial vascular flow voids are within normal limits. Negative noncontrast pituitary, cervicomedullary junction and visualized cervical spine. Visible bone marrow signal is within normal limits. Visible internal auditory structures appear normal. Mild right mastoid effusion. Negative nasopharynx. Negative paranasal sinuses. Orbits soft tissues appear normal. Visualized scalp soft tissues are within normal limits.  IMPRESSION: 1. The patient's IV access infiltrated during attempted contrast administration. No postcontrast imaging could be obtained. 2. Posterior right temporal lobe lesion is confirmed as suspected on the recent cervical spine MRI. A 2-3 cm area vasogenic edema is present with an underlying small 10 mm mass-like area with blood products. 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 An acute to subacute parenchymal brain hemorrhage (i.e. non neoplastic) is felt much less likely in this setting. 3. No other  areas suspicious for metastatic disease on this noncontrast exam.   Electronically Signed   By: Genevie Ann M.D.   On: 11/06/2014 09:30             Medications: I have reviewed the patient's current medications.  Assessment/Plan:  69 year old with:   1. Metastatic malignancy of unknown primary. The differential diagnosis include GI malignancy versus lung cancer but could be really any other solid tumor. Biopsy done today.   2. Sacral metastasis and brain metastasis: He does not have any neurological deficits but certainly would benefit from palliative radiation therapy. His case was discussed with Dr. Tammi Klippel and he will discuss with Mr. Amorin the role of radiation therapy to both areas.   I agree with dexamethasone as you are doing.   3. Anemia: This is related to malignancy versus GI blood loss. I agree with the supportive management as you are doing.  4. Prognosis: Will be dictated by the pathology but I feel his prognosis will be poor regardless. Given the extensive nature of his disease, his debilitated state I think he would be a marginal candidate for aggressive therapy.  He understands any treatment will palliative in nature. I answered his questions again today.   5. Disposition: I do not think he needs to be the hospital to start cancer treatment. If he is medically stable in the next few days, he can be discharged with oncology follow up. This can be done in [place] closer to his home.   Please call with question over the weekend. I will see him back on Monday 6/13 if he is still hospitalized.     LOS: 2 days   [facility] 11/06/2014, 11:25 AM

## 2014-11-06 NOTE — Progress Notes (Signed)
Results for FELIPE, PALUCH (MRN [id number]) as of 11/06/2014 08:34  Ref. Range 11/05/2014 04:30 11/05/2014 12:38 11/05/2014 20:08 11/06/2014 00:20 11/06/2014 03:45  Glucose-Capillary Latest Ref Range: 65-99 mg/dL 183 (H) 140 (H) 274 (H) 381 (H) 412 (H)  CBGs elevated due to starting Decadron. Recommend starting Novolog SENSITIVE correction scale every 4 hours while NPO and if CBGs continue to be greater than 180 mg/dl.  Will continue to follow while in hospital. Harvel Ricks RN BSN CDE

## 2014-11-06 NOTE — Progress Notes (Signed)
Blood sugar greater than 400 at 2000, patient ate some sweets than his family brought him, patient is also on steroids/abx, patient was given SSI and lantus, BS at 2245 (recheck) was still 401, will recheck on 0000.  Paged NP Lynch about insulin scale coverage, will wait for orders

## 2014-11-06 NOTE — Procedures (Signed)
Interventional Radiology Procedure Note  Procedure: US guided core biopsy LEFT liver mass.  33A core x 3  Complications: none  Estimated Blood Loss: 0  Recommendations: - Bedrest x 3 hrs - Path is pending  Signed,  Criselda Peaches, MD

## 2014-11-06 NOTE — Progress Notes (Signed)
Extravasation of 38mL of MRI contrast (MultiHance) and 47mL of saline while using right anterior forearm IV. Radiology RN Sonia Baller) informed of this during hand off for US biopsy. Patient denied pain or discomfort. Only tenderness with palpation of the swelling. Ice pack was applied to forearm and requested to be left there until return to the floor. Spoke with RN caring for him on unit informing her of this. She is to evaluate site upon his return and monitor. Informed patient we would monitor site over next 24 hours.

## 2014-11-06 NOTE — Progress Notes (Signed)
[facility] TEAM 1 - Stepdown/ICU TEAM Progress Note  Stuart Vaughan MWN:[id number] DOB: 20-Jan-1946 DOA: 11/04/2014 PCP: Maryella Shivers, MD  Admit HPI / Brief Narrative: 69 year old M Hx hypertension and hyperlipidemia who had been experiencing increasing low back pain over several weeks. He went to the emergency room because of urinary retention and had a Foley catheter placed. He followed up with a Urologist the day prior to his admit who took the catheter out but patient still could not urinate and the catheter had to be replaced. He later returned to the emergency room c/o shortness of breath and was found to be hypotensive and tachycardic with a hemoglobin around 8 with heme positive stool. A chest x-ray suggested metastatic disease. An MRI of the spine revealed a lesion in the lumbar region.   Of note the patient had an EGD with dilation of an esophageal stricture 4 days prior to this admit. He also had an esophageal ulcer biopsied which was negative. He has never had a colonoscopy.  HPI/Subjective: The patient has no new complaints at this time.  He reports that his lower extremity pain is presently poorly controlled.  He denies chest pain shortness of breath fevers chills nausea or vomiting.  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, pancreatic mass, sacral T2 and brain mets, and soft tissue defect in proximal colon  -Oncology following - s/p liver bx this morning - palliative XRT being considered for bone/brain mets - awaiting biopsy results  Acute respiratory failure with hypoxia / COPD / CAP  multifactorial to include COPD from previous history of smoking,+ metastatic cancer unknown primary - Continue empiric antibiotics for planned short course of tx   Elevated d-dimer Unable to anticoagulate due to acute GI bleed and therefore further evaluation with contrasted CT not pursued  Posterior right temporal lobe lesion w/ 2-3 cm area vasogenic  edema  underlying lesion size 10 mm  Acute GI bleed -GI has evaluated patient and feels bleeding most likely consistent with recent EGD with dilation and esophageal ulcer diagnosed 4 days ago - do not believe further workup warranted.  Low back pain with radiation to the left lower extremity  -Secondary to metastatic cancer unknown origin in the L-spine area with nerve root impingement and invasion into the pre-sacral   -Decadron 10 mg 1; then 4 mg QID  Acute blood loss anemia +/- chronic malignancy associated anemia  - baseline hemoglobin is not known - transfused 2 units PRBC thus far  Hyponatremia  -improving w/ volume expansion - follow  Metabolic acidosis  -Due to acute renal failure - follow  Acute renal failure with urinary retention  -w/ foley and hydration crt is improving - follow trend   Extravasation of MRI contrast right anterior forearm  -Benign exam at present  Steroid induced hyperglycemia  -Dose with sliding scale insulin and follow trend  Code Status: FULL Family Communication: Spoke with patient and wife at length at bedside Disposition Plan: SDU  Consultants: [facility]  Procedure/Significant Events: 6/8 PCXR review of film shows patient with multiple bilateral large well-circumscribed nodules; metastatic carcinoma? 6/9 MRI L-spine without contrast; -L5 and sacral metastasis with tumor in the ventral sacral epidural space; inserting for nerve root impingement LEFT S1 nerve. -Tumor extends into the LEFT presacral space, -Moderate canal stenosis at L2-3 and L4-5. Status post LEFT L4-5 hemilaminectomy. -Neural foraminal narrowing L2-3 through L5-S1: Moderate to severe on the RIGHT at L4-5. 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 -Left T2 vertebral body concerning for metastatic disease.  -Mild chronic compression fractures T3 -moderate to severe  chronic compression fracture T8. -Bilateral lung nodules. Multiple liver lesions, C/W metastatic disease.  6/9Transfused 2 units PRBC  Antibiotics: Zosyn 6/8 > Vancomycin 6/8 >  DVT prophylaxis: SCDs  Objective: Blood pressure 112/73, pulse 75, temperature 98.1 F (36.7 C), temperature source Oral, resp. rate 18, height 5\' 5"  (1.651 m), weight 67.8 kg (149 lb 7.6 oz), SpO2 96 %.  Intake/Output Summary (Last 24 hours) at 11/06/14 1147 Last data filed at 11/06/14 1125  Gross per 24 hour  Intake 1482.5 ml  Output   2000 ml  Net -517.5 ml   Exam: General: No acute respiratory distress - alert and conversant Lungs: Clear to auscultation bilaterally without wheezes or crackles Cardiovascular: Regular rate and rhythm without murmur gallop or rub normal S1 and S2 Abdomen: Nontender, nondistended, soft, bowel sounds positive, no rebound, no ascites, no appreciable mass Extremities: No significant cyanosis, clubbing, or edema bilateral lower extremities  Data Reviewed: Basic Metabolic Panel:  Recent Labs Lab 11/04/14 1936 11/05/14 0522 11/06/14 0232  NA 125* 129* 128*  K 3.8 3.9 3.9  CL 92* 100* 100*  CO2 19* 18* 18*  GLUCOSE 131* 157* 394*  BUN 39* 30* 23*  CREATININE 2.04* 1.57* 1.37*  CALCIUM 8.1* 7.5* 7.6*  MG  --   --  1.8   Liver Function Tests:  Recent Labs Lab 11/05/14 0135 11/05/14 0522 11/06/14 0232  AST 161* 151* 133*  ALT 85* 80* 80*  ALKPHOS 228* 214* 265*  BILITOT 2.2* 2.0* 2.7*  PROT 5.2* 4.8* 4.9*  ALBUMIN 2.3* 2.2* 2.0*   CBC:  Recent Labs Lab 11/04/14 2205 11/05/14 0135 11/05/14 0522 11/05/14 1155 11/06/14 0232  WBC 11.6* 12.1* 10.1 12.6* 10.7*  NEUTROABS  --   --   --   --  9.7*  HGB 8.1* 7.6* 7.5* 7.7* 9.4*  HCT 23.5* 22.0* 21.8* 22.5* 27.7*  MCV 91.1 90.9 91.6 92.2 88.5  PLT 199 189 209 218 216   Cardiac Enzymes:  Recent Labs Lab 11/05/14 0135 11/05/14 1011 11/05/14 1655  TROPONINI <0.03 <0.03 <0.03   CBG:  Recent  Labs Lab 11/05/14 0430 11/05/14 1238 11/05/14 2008 11/06/14 0020 11/06/14 0345  GLUCAP 183* 140* 274* 381* 412*    Recent Results (from the past 240 hour(s))  MRSA PCR Screening     Status: None   Collection Time: 11/05/14  1:05 AM  Result Value Ref Range Status   MRSA by PCR NEGATIVE NEGATIVE Final    Comment:        The GeneXpert MRSA Assay (FDA approved for NASAL specimens only), is one component of a comprehensive MRSA colonization surveillance program. It is not intended to diagnose MRSA infection nor to guide or monitor treatment for MRSA infections.   Culture, blood (routine x 2)     Status: None (Preliminary result)   Collection Time: 11/05/14  3:12 AM  Result Value Ref Range Status   Specimen Description BLOOD RIGHT HAND  Final   Special Requests BOTTLES DRAWN AEROBIC ONLY 4CC  Final   Culture   Final           BLOOD CULTURE RECEIVED NO GROWTH TO DATE CULTURE WILL BE HELD FOR 5 DAYS BEFORE ISSUING A FINAL NEGATIVE REPORT Performed at [facility]    Report Status PENDING  Incomplete  Culture, blood (routine x 2)  Status: None (Preliminary result)   Collection Time: 11/05/14  3:17 AM  Result Value Ref Range Status   Specimen Description BLOOD LEFT HAND  Final   Special Requests BOTTLES DRAWN AEROBIC AND ANAEROBIC 10CC EA  Final   Culture   Final           BLOOD CULTURE RECEIVED NO GROWTH TO DATE CULTURE WILL BE HELD FOR 5 DAYS BEFORE ISSUING A FINAL NEGATIVE REPORT Performed at [facility]    Report Status PENDING  Incomplete  Clostridium Difficile by PCR (not at [facility])     Status: None   Collection Time: 11/05/14 11:01 AM  Result Value Ref Range Status   C difficile by pcr NEGATIVE NEGATIVE Final     Studies:  Recent x-ray studies have been reviewed in detail by the Attending Physician  Scheduled Meds:  Scheduled Meds: . sodium chloride   Intravenous Once  . sodium chloride   Intravenous Once  . dexamethasone  4 mg Intravenous 6  times per day  . dextromethorphan-guaiFENesin  1 tablet Oral BID  . fentaNYL      . gelatin adsorbable      . ipratropium-albuterol  3 mL Nebulization TID  . lidocaine (PF)      . midazolam      . [START ON 11/08/2014] pantoprazole (PROTONIX) IV  40 mg Intravenous Q12H  . piperacillin-tazobactam (ZOSYN)  IV  3.375 g Intravenous Q8H  . vancomycin  1,000 mg Intravenous Q24H    Time spent on care of this patient: 35 mins  Cherene Altes, MD [facility] For Consults/Admissions - Flow Manager - [phone] Office  [phone]  Contact MD directly via text page:      [email or web address]      password [facility]  11/06/2014, 11:47 AM   LOS: 2 days

## 2014-11-07 LAB — GLUCOSE, CAPILLARY
GLUCOSE-CAPILLARY: 217 mg/dL — AB (ref 65–99)
Glucose-Capillary: 241 mg/dL — ABNORMAL HIGH (ref 65–99)
Glucose-Capillary: 248 mg/dL — ABNORMAL HIGH (ref 65–99)
Glucose-Capillary: 259 mg/dL — ABNORMAL HIGH (ref 65–99)
Glucose-Capillary: 304 mg/dL — ABNORMAL HIGH (ref 65–99)
Glucose-Capillary: 359 mg/dL — ABNORMAL HIGH (ref 65–99)

## 2014-11-07 LAB — COMPREHENSIVE METABOLIC PANEL
ALK PHOS: 247 U/L — AB (ref 38–126)
ALT: 80 U/L — AB (ref 17–63)
AST: 121 U/L — AB (ref 15–41)
Albumin: 2 g/dL — ABNORMAL LOW (ref 3.5–5.0)
Anion gap: 10 (ref 5–15)
BUN: 21 mg/dL — AB (ref 6–20)
CALCIUM: 8 mg/dL — AB (ref 8.9–10.3)
CO2: 20 mmol/L — ABNORMAL LOW (ref 22–32)
Chloride: 105 mmol/L (ref 101–111)
Creatinine, Ser: 1.42 mg/dL — ABNORMAL HIGH (ref 0.61–1.24)
GFR calc Af Amer: 56 mL/min — ABNORMAL LOW (ref >60)
GFR calc non Af Amer: 49 mL/min — ABNORMAL LOW (ref >60)
GLUCOSE: 267 mg/dL — AB (ref 65–99)
POTASSIUM: 3.9 mmol/L (ref 3.5–5.1)
Sodium: 135 mmol/L (ref 135–145)
TOTAL PROTEIN: 4.9 g/dL — AB (ref 6.5–8.1)
Total Bilirubin: 1.3 mg/dL — ABNORMAL HIGH (ref 0.3–1.2)

## 2014-11-07 LAB — CBC
HEMATOCRIT: 27.3 % — AB (ref 39.0–52.0)
Hemoglobin: 9.3 g/dL — ABNORMAL LOW (ref 13.0–17.0)
MCH: 30.3 pg (ref 26.0–34.0)
MCHC: 34.1 g/dL (ref 30.0–36.0)
MCV: 88.9 fL (ref 78.0–100.0)
PLATELETS: 214 10*3/uL (ref 150–400)
RBC: 3.07 MIL/uL — ABNORMAL LOW (ref 4.22–5.81)
RDW: 14.9 % (ref 11.5–15.5)
WBC: 13.5 10*3/uL — ABNORMAL HIGH (ref 4.0–10.5)

## 2014-11-07 MED ORDER — INSULIN ASPART 100 UNIT/ML ~~LOC~~ SOLN
0.0000 [IU] | Freq: Every day | SUBCUTANEOUS | Status: DC
  Administered 2014-11-07: 3 [IU] via SUBCUTANEOUS
  Administered 2014-11-07: 5 [IU] via SUBCUTANEOUS
  Administered 2014-11-08 – 2014-11-11 (×2): 3 [IU] via SUBCUTANEOUS

## 2014-11-07 MED ORDER — INSULIN ASPART 100 UNIT/ML ~~LOC~~ SOLN
0.0000 [IU] | Freq: Three times a day (TID) | SUBCUTANEOUS | Status: DC
  Administered 2014-11-07: 5 [IU] via SUBCUTANEOUS
  Administered 2014-11-07: 11 [IU] via SUBCUTANEOUS
  Administered 2014-11-07: 5 [IU] via SUBCUTANEOUS
  Administered 2014-11-08: 15 [IU] via SUBCUTANEOUS
  Administered 2014-11-08: 3 [IU] via SUBCUTANEOUS

## 2014-11-07 MED ORDER — DEXAMETHASONE 4 MG PO TABS
8.0000 mg | ORAL_TABLET | Freq: Two times a day (BID) | ORAL | Status: DC
  Administered 2014-11-07 – 2014-11-12 (×11): 8 mg via ORAL
  Filled 2014-11-07 (×12): qty 2

## 2014-11-07 MED ORDER — SODIUM CHLORIDE 0.9 % IV BOLUS (SEPSIS)
1000.0000 mL | Freq: Once | INTRAVENOUS | Status: AC
  Administered 2014-11-07: 1000 mL via INTRAVENOUS

## 2014-11-07 NOTE — Progress Notes (Signed)
Report called to RN 6N,Patieent eating lunch at this time. I will transfer as soon as he is done with lunch.

## 2014-11-07 NOTE — Progress Notes (Signed)
SBP in the 70-80s, MAPs in the 50s, NS 500 cc bolus given at 2330.  Paged NP Lynch, orders received for NS 1L bolus

## 2014-11-07 NOTE — Progress Notes (Signed)
ANTIBIOTIC CONSULT NOTE - FOLLOW UP  Pharmacy Consult for Vanc/Zosyn Indication: rule out sepsis  No Known Allergies  Patient Measurements: Height: 5\' 5"  (165.1 cm) Weight: 157 lb 3 oz (71.3 kg) IBW/kg (Calculated) : 61.5  Vital Signs: Temp: 97.3 F (36.3 C) (06/11 0800) Temp Source: Axillary (06/11 0800) BP: 105/54 mmHg (06/11 0800) Pulse Rate: 83 (06/11 0800) Intake/Output from previous day: 06/10 0701 - 06/11 0700 In: 350 [IV Piggyback:350] Out: 1600 [Urine:1600] Intake/Output from this shift:    Labs:  Recent Labs  11/05/14 0522 11/05/14 1155 11/06/14 0232 11/07/14 0245  WBC 10.1 12.6* 10.7* 13.5*  HGB 7.5* 7.7* 9.4* 9.3*  PLT 209 218 216 214  CREATININE 1.57*  --  1.37* 1.42*   Estimated Creatinine Clearance: 42.1 mL/min (by C-G formula based on Cr of 1.42). No results for input(s): VANCOTROUGH, VANCOPEAK, VANCORANDOM, GENTTROUGH, GENTPEAK, GENTRANDOM, TOBRATROUGH, TOBRAPEAK, TOBRARND, AMIKACINPEAK, AMIKACINTROU, AMIKACIN in the last 72 hours.   Microbiology: Recent Results (from the past 720 hour(s))  MRSA PCR Screening     Status: None   Collection Time: 11/05/14  1:05 AM  Result Value Ref Range Status   MRSA by PCR NEGATIVE NEGATIVE Final    Comment:        The GeneXpert MRSA Assay (FDA approved for NASAL specimens only), is one component of a comprehensive MRSA colonization surveillance program. It is not intended to diagnose MRSA infection nor to guide or monitor treatment for MRSA infections.   Culture, blood (routine x 2)     Status: None (Preliminary result)   Collection Time: 11/05/14  3:12 AM  Result Value Ref Range Status   Specimen Description BLOOD RIGHT HAND  Final   Special Requests BOTTLES DRAWN AEROBIC ONLY 4CC  Final   Culture   Final           BLOOD CULTURE RECEIVED NO GROWTH TO DATE CULTURE WILL BE HELD FOR 5 DAYS BEFORE ISSUING A FINAL NEGATIVE REPORT Performed at [facility]    Report Status PENDING  Incomplete   Culture, blood (routine x 2)     Status: None (Preliminary result)   Collection Time: 11/05/14  3:17 AM  Result Value Ref Range Status   Specimen Description BLOOD LEFT HAND  Final   Special Requests BOTTLES DRAWN AEROBIC AND ANAEROBIC 10CC EA  Final   Culture   Final           BLOOD CULTURE RECEIVED NO GROWTH TO DATE CULTURE WILL BE HELD FOR 5 DAYS BEFORE ISSUING A FINAL NEGATIVE REPORT Performed at [facility]    Report Status PENDING  Incomplete  Clostridium Difficile by PCR (not at [facility])     Status: None   Collection Time: 11/05/14 11:01 AM  Result Value Ref Range Status   C difficile by pcr NEGATIVE NEGATIVE Final    Anti-infectives    Start     Dose/Rate Route Frequency Ordered Stop   11/05/14 0600  piperacillin-tazobactam (ZOSYN) IVPB 3.375 g     3.375 g 12.5 mL/hr over 240 Minutes Intravenous Every 8 hours 11/04/14 2326     11/05/14 0000  vancomycin (VANCOCIN) IVPB 1000 mg/200 mL premix     1,000 mg 200 mL/hr over 60 Minutes Intravenous Every 24 hours 11/04/14 2326     11/04/14 2330  piperacillin-tazobactam (ZOSYN) IVPB 3.375 g     3.375 g 100 mL/hr over 30 Minutes Intravenous  Once 11/04/14 2326 11/05/14 0000      Assessment: 69yo M on broad  spectrum abx for r/o sepsis vs CAP. D#3. MD note states planning for short course of tx. Tmax 102.8> currently afebrile, WBC 13.5, cultures pending - LA 1.3, PCT 1.28>>0.92.   Goal of Therapy:  Vancomycin trough level 15-20 mcg/ml  Plan:  Continue Vanc 1g q24h Continue Zosyn 3.375g IV q8h EI Planning for short course so will no obtain VT at this time Monitor renal function for dosage adjustment Monitor cultures and clinical progress  Thank you for allowing pharmacy to be part of this patient's care team  [place], Pharm.D Clinical Pharmacy Resident Pager: [phone] 11/07/2014 .10:52 AM

## 2014-11-07 NOTE — Progress Notes (Signed)
  Radiation Oncology         [phone] ________________________________  Name: Stuart Vaughan  MRN: [id number]  Date: 11/04/2014  DOB: 1945-10-24  Chart Note:  After speaking with Dr. Alen Blew, I reviewed this patient's most recent findings.  He may be a candidate for palliative radiotherapy, pending biopsy.  Will see patient tomorrow afternoon in anticipation of positive biopsy. ________________________________  Sheral Apley Tammi Klippel, M.D.

## 2014-11-07 NOTE — Progress Notes (Signed)
[name] TEAM 1 - Stepdown/ICU TEAM Progress Note  Stuart Vaughan WJX:[id number] DOB: 1945/10/29 DOA: 11/04/2014 PCP: Maryella Shivers, MD  Admit HPI / Brief Narrative: 69 year old M Hx hypertension and hyperlipidemia who had been experiencing increasing low back pain over several weeks. He went to the emergency room because of urinary retention and had a Foley catheter placed. He followed up with a Urologist the day prior to his admit who took the catheter out but patient still could not urinate and the catheter had to be replaced. He later returned to the emergency room c/o shortness of breath and was found to be hypotensive and tachycardic with a hemoglobin around 8 with heme positive stool. A chest x-ray suggested metastatic disease. An MRI of the spine revealed a lesion in the lumbar region.   Of note the patient had an EGD with dilation of an esophageal stricture 4 days prior to this admit. He also had an esophageal ulcer biopsied which was negative. He has never had a colonoscopy.  HPI/Subjective: The patient had an episode of hypotension last night.  He was essentially asymptomatic.  This responded well to volume challenges.  This morning he has no complaints whatsoever.  He is very anxious to get up and get moving.  He denies chest pain fevers chills nausea vomiting or abdominal pain.  He does have persistent pain in his legs and feet but states that overall this has improved.  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, pancreatic mass, sacral T2 and brain mets, and soft tissue defect in proximal colon  -Oncology following - s/p liver bx with results pending - palliative XRT being considered for bone/brain mets - awaiting biopsy results  Acute respiratory failure with hypoxia / COPD / CAP  multifactorial to include COPD from previous history of smoking + metastatic cancer unknown primary - continue empiric antibiotics for planned short course of  tx (5 days)  Elevated d-dimer Unable to anticoagulate due to acute GI bleed and therefore further evaluation with contrasted CT not pursued  Posterior right temporal lobe lesion w/ 2-3 cm area vasogenic edema  underlying lesion size 10 mm  Acute GI bleed -GI has evaluated patient and feels bleeding most likely consistent with recent EGD with dilation and esophageal ulcer diagnosed 4 days ago - do not believe further workup warranted - hemoglobin is stable  Low back pain with radiation to the left lower extremity  -Secondary to metastatic cancer unknown origin in the L-spine area with nerve root impingement and invasion into the sacrum  -Decadron 4 mg QID continues  Acute blood loss anemia +/- chronic malignancy associated anemia  - baseline hemoglobin is not known - transfused 2 units PRBC thus far - hemoglobin stable  Hyponatremia  -Resolved w/ volume expansion - follow  Metabolic acidosis  -Due to acute renal failure - essentially resolved - recheck in a.m.  Acute renal failure with urinary retention  -w/ foley and hydration crt appears to have stabilized at approximately 1.4 - will not attempt to remove Foley catheter  Extravasation of MRI contrast right anterior forearm  -Benign exam   Steroid induced hyperglycemia  -SSI adjusted - follow trend  Code Status: FULL Family Communication: Spoke with patient and wife at length at bedside Disposition Plan: Stable for transfer to medical bed - possible discharge in next 24-48 hours  Consultants: [facility]  Procedure/Significant Events: 6/8 PCXR review of film shows patient with multiple bilateral large well-circumscribed nodules; metastatic carcinoma?  6/9 MRI L-spine without contrast; -L5 and sacral metastasis with tumor in the ventral sacral epidural space; inserting for nerve root impingement LEFT S1 nerve. -Tumor extends into the LEFT presacral space, -Moderate canal stenosis at L2-3 and L4-5. Status post  LEFT L4-5 hemilaminectomy. -Neural foraminal narrowing L2-3 through L5-S1: Moderate to severe on the RIGHT at L4-5. 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 -Left T2 vertebral body concerning for metastatic disease.  -Mild chronic compression fractures T3 -moderate to severe chronic compression fracture T8. -Bilateral lung nodules. Multiple liver lesions, C/W metastatic disease.  6/9Transfused 2 units PRBC  Antibiotics: Zosyn 6/8 > Vancomycin 6/8 >  DVT prophylaxis: SCDs  Objective: Blood pressure 105/54, pulse 83, temperature 97.3 F (36.3 C), temperature source Axillary, resp. rate 29, height 5\' 5"  (1.651 m), weight 71.3 kg (157 lb 3 oz), SpO2 98 %.  Intake/Output Summary (Last 24 hours) at 11/07/14 0957 Last data filed at 11/07/14 0630  Gross per 24 hour  Intake    350 ml  Output   1600 ml  Net  -1250 ml   Exam: General: No acute respiratory distress - alert and conversant and quite pleasant Lungs: Clear to auscultation bilaterally without wheezes/crackles Cardiovascular: Regular rate and rhythm without murmur gallop or rub  Abdomen: Nontender, nondistended, soft, bowel sounds positive, no rebound, no ascites, no appreciable mass Extremities: No significant cyanosis, clubbing, edema bilateral lower extremities  Data Reviewed: Basic Metabolic Panel:  Recent Labs Lab 11/04/14 1936 11/05/14 0522 11/06/14 0232 11/07/14 0245  NA 125* 129* 128* 135  K 3.8 3.9 3.9 3.9  CL 92* 100* 100* 105  CO2 19* 18* 18* 20*  GLUCOSE 131* 157* 394* 267*  BUN 39* 30* 23* 21*  CREATININE 2.04* 1.57* 1.37* 1.42*  CALCIUM 8.1* 7.5* 7.6* 8.0*  MG  --   --  1.8  --    Liver Function Tests:  Recent Labs Lab 11/05/14 0135 11/05/14 0522 11/06/14 0232 11/07/14 0245  AST 161* 151* 133* 121*  ALT 85* 80* 80* 80*  ALKPHOS 228* 214* 265* 247*  BILITOT 2.2* 2.0* 2.7* 1.3*  PROT 5.2* 4.8* 4.9*  4.9*  ALBUMIN 2.3* 2.2* 2.0* 2.0*   CBC:  Recent Labs Lab 11/05/14 0135 11/05/14 0522 11/05/14 1155 11/06/14 0232 11/07/14 0245  WBC 12.1* 10.1 12.6* 10.7* 13.5*  NEUTROABS  --   --   --  9.7*  --   HGB 7.6* 7.5* 7.7* 9.4* 9.3*  HCT 22.0* 21.8* 22.5* 27.7* 27.3*  MCV 90.9 91.6 92.2 88.5 88.9  PLT 189 209 218 216 214   Cardiac Enzymes:  Recent Labs Lab 11/05/14 0135 11/05/14 1011 11/05/14 1655  TROPONINI <0.03 <0.03 <0.03   CBG:  Recent Labs Lab 11/06/14 1648 11/06/14 2003 11/06/14 2245 11/07/14 11/07/14 0410  GLUCAP 351* 433* 401* 359* 217*    Recent Results (from the past 240 hour(s))  MRSA PCR Screening     Status: None   Collection Time: 11/05/14  1:05 AM  Result Value Ref Range Status   MRSA by PCR NEGATIVE NEGATIVE Final    Comment:        The GeneXpert MRSA Assay (FDA approved for NASAL specimens only), is one component of a comprehensive MRSA colonization surveillance program. It is not intended to diagnose MRSA infection nor to guide or monitor treatment for MRSA infections.   Culture, blood (routine x 2)     Status: None (Preliminary result)   Collection Time:  11/05/14  3:12 AM  Result Value Ref Range Status   Specimen Description BLOOD RIGHT HAND  Final   Special Requests BOTTLES DRAWN AEROBIC ONLY 4CC  Final   Culture   Final           BLOOD CULTURE RECEIVED NO GROWTH TO DATE CULTURE WILL BE HELD FOR 5 DAYS BEFORE ISSUING A FINAL NEGATIVE REPORT Performed at [facility]    Report Status PENDING  Incomplete  Culture, blood (routine x 2)     Status: None (Preliminary result)   Collection Time: 11/05/14  3:17 AM  Result Value Ref Range Status   Specimen Description BLOOD LEFT HAND  Final   Special Requests BOTTLES DRAWN AEROBIC AND ANAEROBIC 10CC EA  Final   Culture   Final           BLOOD CULTURE RECEIVED NO GROWTH TO DATE CULTURE WILL BE HELD FOR 5 DAYS BEFORE ISSUING A FINAL NEGATIVE REPORT Performed at [facility]     Report Status PENDING  Incomplete  Clostridium Difficile by PCR (not at [facility])     Status: None   Collection Time: 11/05/14 11:01 AM  Result Value Ref Range Status   C difficile by pcr NEGATIVE NEGATIVE Final     Studies:  Recent x-ray studies have been reviewed in detail by the Attending Physician  Scheduled Meds:  Scheduled Meds: . dexamethasone  4 mg Intravenous 6 times per day  . dextromethorphan-guaiFENesin  1 tablet Oral BID  . insulin aspart  0-15 Units Subcutaneous TID WC  . insulin aspart  0-5 Units Subcutaneous QHS  . insulin glargine  14 Units Subcutaneous QHS  . ipratropium-albuterol  3 mL Nebulization TID  . pantoprazole  40 mg Oral BID  . piperacillin-tazobactam (ZOSYN)  IV  3.375 g Intravenous Q8H  . vancomycin  1,000 mg Intravenous Q24H    Time spent on care of this patient: 35 mins  Cherene Altes, MD [facility] For Consults/Admissions - Flow Manager - [phone] Office  [phone]  Contact MD directly via text page:      [email or web address]      password [email or web address]  11/07/2014, 9:57 AM   LOS: 3 days

## 2014-11-08 ENCOUNTER — Inpatient Hospital Stay (HOSPITAL_COMMUNITY): Payer: Medicare Other

## 2014-11-08 DIAGNOSIS — M7989 Other specified soft tissue disorders: Secondary | ICD-10-CM

## 2014-11-08 LAB — CBC
HCT: 30.9 % — ABNORMAL LOW (ref 39.0–52.0)
HEMATOCRIT: 27.9 % — AB (ref 39.0–52.0)
Hemoglobin: 9.5 g/dL — ABNORMAL LOW (ref 13.0–17.0)
Hemoglobin: 10.6 g/dL — ABNORMAL LOW (ref 13.0–17.0)
MCH: 30.3 pg (ref 26.0–34.0)
MCH: 30.8 pg (ref 26.0–34.0)
MCHC: 34.1 g/dL (ref 30.0–36.0)
MCHC: 34.3 g/dL (ref 30.0–36.0)
MCV: 88.9 fL (ref 78.0–100.0)
MCV: 89.8 fL (ref 78.0–100.0)
Platelets: 186 10*3/uL (ref 150–400)
Platelets: 171 10*3/uL (ref 150–400)
RBC: 3.14 MIL/uL — AB (ref 4.22–5.81)
RBC: 3.44 MIL/uL — AB (ref 4.22–5.81)
RDW: 14.9 % (ref 11.5–15.5)
RDW: 14.9 % (ref 11.5–15.5)
WBC: 12.7 10*3/uL — ABNORMAL HIGH (ref 4.0–10.5)
WBC: 15.5 10*3/uL — AB (ref 4.0–10.5)

## 2014-11-08 LAB — GLUCOSE, CAPILLARY
GLUCOSE-CAPILLARY: 162 mg/dL — AB (ref 65–99)
Glucose-Capillary: 164 mg/dL — ABNORMAL HIGH (ref 65–99)
Glucose-Capillary: 368 mg/dL — ABNORMAL HIGH (ref 65–99)

## 2014-11-08 LAB — COMPREHENSIVE METABOLIC PANEL
ALT: 76 U/L — ABNORMAL HIGH (ref 17–63)
AST: 24 U/L (ref 15–41)
Albumin: 2 g/dL — ABNORMAL LOW (ref 3.5–5.0)
Alkaline Phosphatase: 230 U/L — ABNORMAL HIGH (ref 38–126)
Anion gap: 10 (ref 5–15)
BILIRUBIN TOTAL: 1.1 mg/dL (ref 0.3–1.2)
BUN: 23 mg/dL — AB (ref 6–20)
CO2: 20 mmol/L — AB (ref 22–32)
CREATININE: 1.38 mg/dL — AB (ref 0.61–1.24)
Calcium: 7.9 mg/dL — ABNORMAL LOW (ref 8.9–10.3)
Chloride: 103 mmol/L (ref 101–111)
GFR calc non Af Amer: 50 mL/min — ABNORMAL LOW (ref >60)
GFR, EST AFRICAN AMERICAN: 58 mL/min — AB (ref >60)
Glucose, Bld: 207 mg/dL — ABNORMAL HIGH (ref 65–99)
POTASSIUM: 4.4 mmol/L (ref 3.5–5.1)
SODIUM: 133 mmol/L — AB (ref 135–145)
Total Protein: 4.7 g/dL — ABNORMAL LOW (ref 6.5–8.1)

## 2014-11-08 LAB — PROCALCITONIN: PROCALCITONIN: 0.45 ng/mL

## 2014-11-08 MED ORDER — SUCRALFATE 1 GM/10ML PO SUSP
1.0000 g | Freq: Three times a day (TID) | ORAL | Status: DC
  Administered 2014-11-08 – 2014-11-12 (×15): 1 g via ORAL
  Filled 2014-11-08 (×15): qty 10

## 2014-11-08 MED ORDER — INSULIN ASPART 100 UNIT/ML ~~LOC~~ SOLN
0.0000 [IU] | Freq: Three times a day (TID) | SUBCUTANEOUS | Status: DC
  Administered 2014-11-08: 4 [IU] via SUBCUTANEOUS
  Administered 2014-11-09: 15 [IU] via SUBCUTANEOUS
  Administered 2014-11-09 (×2): 4 [IU] via SUBCUTANEOUS
  Administered 2014-11-10: 3 [IU] via SUBCUTANEOUS
  Administered 2014-11-10: 4 [IU] via SUBCUTANEOUS
  Administered 2014-11-11: 3 [IU] via SUBCUTANEOUS
  Administered 2014-11-11: 7 [IU] via SUBCUTANEOUS
  Administered 2014-11-12: 15 [IU] via SUBCUTANEOUS
  Administered 2014-11-12: 4 [IU] via SUBCUTANEOUS

## 2014-11-08 MED ORDER — ZOLPIDEM TARTRATE 5 MG PO TABS
5.0000 mg | ORAL_TABLET | Freq: Once | ORAL | Status: AC
  Administered 2014-11-08: 5 mg via ORAL
  Filled 2014-11-08: qty 1

## 2014-11-08 MED ORDER — ENOXAPARIN SODIUM 60 MG/0.6ML ~~LOC~~ SOLN
1.0000 mg/kg | Freq: Two times a day (BID) | SUBCUTANEOUS | Status: DC
  Administered 2014-11-08 – 2014-11-12 (×8): 60 mg via SUBCUTANEOUS
  Filled 2014-11-08 (×8): qty 0.6

## 2014-11-08 MED ORDER — INSULIN ASPART 100 UNIT/ML ~~LOC~~ SOLN: 0.0000 [IU] | Freq: Every day | SUBCUTANEOUS | Status: DC

## 2014-11-08 MED ORDER — INSULIN GLARGINE 100 UNIT/ML ~~LOC~~ SOLN
18.0000 [IU] | Freq: Every day | SUBCUTANEOUS | Status: DC
  Administered 2014-11-08 – 2014-11-11 (×4): 18 [IU] via SUBCUTANEOUS
  Filled 2014-11-08 (×5): qty 0.18

## 2014-11-08 NOTE — Progress Notes (Signed)
*  PRELIMINARY RESULTS* Vascular Ultrasound Lower extremity venous duplex has been completed.  Preliminary findings: DVT noted in the Left gastroc veins. No DVT RLE.  Landry Mellow, RDMS, RVT  11/08/2014, 5:05 PM

## 2014-11-08 NOTE — Consult Note (Signed)
Radiation Oncology         [phone] ________________________________  Initial inpatient Consultation  Name: Stuart Vaughan MRN: [id number]  Date: 11/04/2014  DOB: 1945-08-29  PF:[id number], MD  No ref. provider found   REFERRING PHYSICIAN:   Dr. 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    ICD-9-CM ICD-10-CM   1. Upper GI bleeding 578.9 K92.2   2. Low back pain 724.2 M54.5 MR Lumbar Spine Wo Contrast     MR Lumbar Spine Wo Contrast  3. Metastasis 199.1 C79.9   4. GI bleed 578.9 K92.2 CT Abdomen Pelvis Wo Contrast     CT Abdomen Pelvis Wo Contrast  5. Pulmonary nodules 793.19 R91.8 CT Chest Wo Contrast     CT Chest Wo Contrast  6. Metastatic cancer 199.1 C80.1 MR Brain W Wo Contrast     MR Brain W Wo Contrast     CANCELED: MR Brain W Contrast     CANCELED: MR Brain W Contrast  7. Lung mass 786.6 R91.8 US Biopsy     US Biopsy     CANCELED: CT Biopsy     CANCELED: CT Biopsy     CANCELED: IR US Guide Bx Asp/Drain     CANCELED: IR US Guide Bx Asp/Drain    HISTORY OF PRESENT ILLNESS::Stuart Vaughan is a 69 y.o. male who was admitted on 6/8 with a progressive two week history of lower back pain, not amenable to conservative treatment as outpatient. He had been seen at [facility] when he began experiencing urinary retention, requiring a placement of a urine catheter which he eventually removed due to discomfort. His back pain was severe and he developed shortness of breath, chills, hypotension and tachycardia. He had noted stool incontinence as well.  MRI of the lumbar spine without contrast remarkable for L5 and sacral mets with tumor in the ventral sacral epidural space which likely affects the traversing LEFT S1 nerve. No pathologic fracture. Tumor extends into the LEFT presacral space. MRI Cervical / Thoracic spine without contrast showed cervical spondylosis, spinal stenosis, edema in  the R occipital lobe, abnormal T2 vertebral body signal all concerning for metastatic disease, chronic compression fractures of T3, T8, bilateral lung nodules, & multiple liver lesions. CT of the chest, abdomen and pelvis on 6/9 showed the following: 1. Extensive pulmonary metastasis and liver metastasis. 2. Mass involving head of pancreas may represent a focus of metastatic disease or primary pancreatic carcinoma. 3. Bone metastasis are better seen on MRI from 11/04/14 an 11/05/2014. There is a large mass involving the central portion of the sacrum with associated increased left-sided presacral soft tissue. 4. Aortic atherosclerosis. 5. Infrarenal abdominal aortic aneurysm. Recommend followup by ultrasound in 3 years. This recommendation follows ACR consensus guidelines: White Paper of the ACR Incidental Findings Committee II on Vascular Findings. J Am Coll Radiol 2013; 42:683-419 6. Indeterminate soft tissue attenuating filling defect within the proximal colon may represent neoplasm. On 6/10, IR performed a U/S liver biopsy which is pending.  Of note, CBC was remarkable for a Hb of 8.4, with heme positive stools.  He was found to have a left leg DVT, and there is some concern about the best way to anticoagulate in the setting of possible GI bleed.  He is starting on lovenox for that.  PREVIOUS RADIATION THERAPY: No  PAST MEDICAL HISTORY:  has a past medical history of Hypertension; High cholesterol; and Sciatica.  PAST SURGICAL HISTORY: Past Surgical History  Procedure Laterality Date  . Cyst removal from spine    . Back surgery      FAMILY HISTORY: family history is negative for Diabetes Mellitus II.  SOCIAL HISTORY:  reports that he quit smoking about 3 years ago. His smoking use included Cigarettes. He has a 50 pack-year smoking history. He does not have any smokeless tobacco history on file. He reports that he does not drink alcohol or use illicit drugs.  ALLERGIES: Review of  patient's allergies indicates no known allergies.  MEDICATIONS:  Current Facility-Administered Medications  Medication Dose Route Frequency Provider Last Rate Last Dose  . acetaminophen (TYLENOL) tablet 650 mg  650 mg Oral Q6H PRN Cherene Altes, MD      . acetaminophen-codeine (TYLENOL #3) 300-30 MG per tablet 1-2 tablet  1-2 tablet Oral Q4H PRN Cherene Altes, MD      . albuterol (PROVENTIL) (2.5 MG/3ML) 0.083% nebulizer solution 2.5 mg  2.5 mg Nebulization Q3H PRN Donita Brooks, NP   2.5 mg at 11/08/14 1711  . dexamethasone (DECADRON) tablet 8 mg  8 mg Oral Q12H Cherene Altes, MD   8 mg at 11/08/14 1117  . dextromethorphan-guaiFENesin (MUCINEX DM) 30-600 MG per 12 hr tablet 1 tablet  1 tablet Oral BID Allie Bossier, MD   1 tablet at 11/07/14 2142  . enoxaparin (LOVENOX) injection 60 mg  1 mg/kg Subcutaneous Q12H Cherene Altes, MD   60 mg at 11/08/14 1822  . insulin aspart (novoLOG) injection 0-20 Units  0-20 Units Subcutaneous TID WC Cherene Altes, MD   4 Units at 11/08/14 1744  . insulin aspart (novoLOG) injection 0-5 Units  0-5 Units Subcutaneous QHS Ritta Slot, NP   3 Units at 11/07/14 2143  . insulin glargine (LANTUS) injection 18 Units  18 Units Subcutaneous QHS Cherene Altes, MD      . ipratropium-albuterol (DUONEB) 0.5-2.5 (3) MG/3ML nebulizer solution 3 mL  3 mL Nebulization TID Allie Bossier, MD   3 mL at 11/08/14 1236  . morphine 2 MG/ML injection 2-4 mg  2-4 mg Intravenous Q2H PRN Cherene Altes, MD   4 mg at 11/07/14 2142  . ondansetron (ZOFRAN) tablet 4 mg  4 mg Oral Q6H PRN Rise Patience, MD       Or  . ondansetron [facility]) injection 4 mg  4 mg Intravenous Q6H PRN Rise Patience, MD   4 mg at 11/07/14 2142  . oxyCODONE (Oxy IR/ROXICODONE) immediate release tablet 5-10 mg  5-10 mg Oral Q3H PRN Cherene Altes, MD   10 mg at 11/07/14 2143  . pantoprazole (PROTONIX) EC tablet 40 mg  40 mg Oral BID Cherene Altes, MD   40 mg at 11/08/14  1118  . piperacillin-tazobactam (ZOSYN) IVPB 3.375 g  3.375 g Intravenous Q8H Cherene Altes, MD   3.375 g at 11/08/14 1506  . sucralfate (CARAFATE) 1 GM/10ML suspension 1 g  1 g Oral TID WC & HS Cherene Altes, MD        REVIEW OF SYSTEMS:  A 15 point review of systems is documented in the electronic medical record. This was obtained by the nursing staff. However, I reviewed this with the patient to discuss relevant findings and make appropriate changes.  Pertinent items are noted in HPI.   PHYSICAL EXAM:  height is 5\' 5"  (1.651 m) and weight is 137 lb 9.6 oz (62.415 kg). His  oral temperature is 98.4 F (36.9 C). His blood pressure is 126/77 and his pulse is 82. His respiration is 20 and oxygen saturation is 92%.   Per med-onc GENERAL:alert, no distress, pale SKIN: skin color, texture, turgor are normal, no rashes or significant lesions EYES: normal, conjunctiva are pink and non-injected, sclera clear OROPHARYNX:no exudate, no erythema and lips, buccal mucosa, and tongue normal  NECK: supple, thyroid normal size, non-tender, without nodularity LYMPH: no palpable lymphadenopathy in the cervical, axillary or inguinal LUNGS: wheezing present, no rhonchi or rales with some breathing effort. Barrel chest noted HEART: regular rate & rhythm and no murmurs and no lower extremity edema ABDOMEN: abdomen soft, non-tender and normal bowel sounds Musculoskeletal:no cyanosis of digits and no clubbing  PSYCH: alert & oriented x 3 with fluent speech NEURO: no focal motor/sensory deficits  KPS = 40  100 - Normal; no complaints; no evidence of disease. 90   - Able to carry on normal activity; minor signs or symptoms of disease. 80   - Normal activity with effort; some signs or symptoms of disease. 48   - Cares for self; unable to carry on normal activity or to do active work. 60   - Requires occasional assistance, but is able to care for most of his personal needs. 50   - Requires considerable  assistance and frequent medical care. 4   - Disabled; requires special care and assistance. 13   - Severely disabled; hospital admission is indicated although death not imminent. 51   - Very sick; hospital admission necessary; active supportive treatment necessary. 10   - Moribund; fatal processes progressing rapidly. 0     - Dead  Karnofsky DA, Abelmann [place], Craver LS and Burchenal JH [phone]) The use of the nitrogen mustards in the palliative treatment of carcinoma: with particular reference to bronchogenic carcinoma Cancer 1 634-56  LABORATORY DATA:  Lab Results  Component Value Date   WBC 12.7* 11/08/2014   HGB 9.5* 11/08/2014   HCT 27.9* 11/08/2014   MCV 88.9 11/08/2014   PLT 186 11/08/2014   Lab Results  Component Value Date   NA 133* 11/08/2014   K 4.4 11/08/2014   CL 103 11/08/2014   CO2 20* 11/08/2014   Lab Results  Component Value Date   ALT 76* 11/08/2014   AST 24 11/08/2014   ALKPHOS 230* 11/08/2014   BILITOT 1.1 11/08/2014     RADIOGRAPHY: Ct Abdomen Pelvis Wo Contrast  11/05/2014   CLINICAL DATA:  69 year old with GI bleed and pulmonary embolus.  EXAM: CT CHEST, ABDOMEN AND PELVIS WITHOUT CONTRAST  TECHNIQUE: Multidetector CT imaging of the chest, abdomen and pelvis was performed following the standard protocol without IV contrast.  COMPARISON:  None.  FINDINGS: CT CHEST FINDINGS  Mediastinum: The heart size is normal. No pericardial effusion. Small hiatal hernia noted. Aortic atherosclerosis is identified. There also calcifications within the LAD and left circumflex coronary artery. Left paratracheal lymph node measures 1.4 cm, image 23/series 2. Sub- carinal lymph node measures 1.1 cm, image 28/series 2.  Lungs/Pleura: Advanced changes of centrilobular and paraseptal emphysema. Numerous pulmonary nodules are identified bilaterally. Index nodule within the right upper lobe measures 3.4 cm, image 17/series 3. In the left upper lobe there is a nodule measuring 2.4 cm,  image 22/series 3. Within the right lower lobe there is a 1.9 cm nodule, image 42/series 3.  Musculoskeletal: No aggressive lytic or sclerotic bone lesions. T8 compression fracture is again noted. T2 lesion identified on recent MRI  is not well seen.  CT ABDOMEN AND PELVIS FINDINGS  Hepatobiliary: Extensive liver metastases are identified involving both lobes. Large index lesion within the right hepatic lobe measures 6.2 cm, image 51/series 2. Index lesion within the lateral segment of left lobe measures 6.3 cm, image 51/series 2. Index lesion within the inferior right hepatic lobe measures 2.3 cm, image 69/series 2. The gallbladder appears normal. No biliary dilatation.  Pancreas: Masslike enlargement of the head of pancreas measures 3.8 cm, image 68/series 2.  Spleen: There is a low attenuation structure within the central spleen measuring 1.9 cm, image 53/series 2.  Adrenals/Urinary Tract: The adrenal glands are both normal. Bilateral renal cysts are identified. These are incompletely characterized without IV contrast. The urinary bladder is collapsed around a Foley catheter balloon.  Stomach/Bowel: Hiatal hernia noted. The stomach is otherwise unremarkable. The small bowel loops have a normal course and caliber. A 3.3 cm soft tissue attenuating filling defects arise from the lateral wall of the ascending colon, image 87/ series 2. This is of uncertain significance.  Vascular/Lymphatic: Calcified atherosclerotic disease involves the abdominal aorta. Infrarenal abdominal aortic aneurysm measures 3.1 cm.  Reproductive: Prostate gland appears enlarged. Symmetric appearance of the seminal vesicles.  Other: Increased presacral soft tissue along the left is identified, image 95/series 2 and is worrisome for tumor.  Musculoskeletal: Large mass involving the sacrum is identified. This measures at least 6.3 cm, image 91/series 2. This is better seen on MRI from 11/04/14  IMPRESSION: 1. Extensive pulmonary metastasis and  liver metastasis. 2. Mass involving head of pancreas may represent a focus of metastatic disease or primary pancreatic carcinoma. 3. Bone metastasis are better seen on MRI from 11/04/14 an 11/05/2014. There is a large mass involving the central portion of the sacrum with associated increased left-sided presacral soft tissue. 4. Aortic atherosclerosis. 5. Infrarenal abdominal aortic aneurysm. Recommend followup by ultrasound in 3 years. This recommendation follows ACR consensus guidelines: White Paper of the ACR Incidental Findings Committee II on Vascular Findings. J Am Coll Radiol 2013; 08:657-846 6. Indeterminate soft tissue attenuating filling defect within the proximal colon may represent neoplasm.   Electronically Signed   By: Kerby Moors M.D.   On: 11/05/2014 15:05   Ct Chest Wo Contrast  11/05/2014   CLINICAL DATA:  69 year old with GI bleed and pulmonary embolus.  EXAM: CT CHEST, ABDOMEN AND PELVIS WITHOUT CONTRAST  TECHNIQUE: Multidetector CT imaging of the chest, abdomen and pelvis was performed following the standard protocol without IV contrast.  COMPARISON:  None.  FINDINGS: CT CHEST FINDINGS  Mediastinum: The heart size is normal. No pericardial effusion. Small hiatal hernia noted. Aortic atherosclerosis is identified. There also calcifications within the LAD and left circumflex coronary artery. Left paratracheal lymph node measures 1.4 cm, image 23/series 2. Sub- carinal lymph node measures 1.1 cm, image 28/series 2.  Lungs/Pleura: Advanced changes of centrilobular and paraseptal emphysema. Numerous pulmonary nodules are identified bilaterally. Index nodule within the right upper lobe measures 3.4 cm, image 17/series 3. In the left upper lobe there is a nodule measuring 2.4 cm, image 22/series 3. Within the right lower lobe there is a 1.9 cm nodule, image 42/series 3.  Musculoskeletal: No aggressive lytic or sclerotic bone lesions. T8 compression fracture is again noted. T2 lesion identified on  recent MRI is not well seen.  CT ABDOMEN AND PELVIS FINDINGS  Hepatobiliary: Extensive liver metastases are identified involving both lobes. Large index lesion within the right hepatic lobe measures 6.2 cm, image 51/series 2.  Index lesion within the lateral segment of left lobe measures 6.3 cm, image 51/series 2. Index lesion within the inferior right hepatic lobe measures 2.3 cm, image 69/series 2. The gallbladder appears normal. No biliary dilatation.  Pancreas: Masslike enlargement of the head of pancreas measures 3.8 cm, image 68/series 2.  Spleen: There is a low attenuation structure within the central spleen measuring 1.9 cm, image 53/series 2.  Adrenals/Urinary Tract: The adrenal glands are both normal. Bilateral renal cysts are identified. These are incompletely characterized without IV contrast. The urinary bladder is collapsed around a Foley catheter balloon.  Stomach/Bowel: Hiatal hernia noted. The stomach is otherwise unremarkable. The small bowel loops have a normal course and caliber. A 3.3 cm soft tissue attenuating filling defects arise from the lateral wall of the ascending colon, image 87/ series 2. This is of uncertain significance.  Vascular/Lymphatic: Calcified atherosclerotic disease involves the abdominal aorta. Infrarenal abdominal aortic aneurysm measures 3.1 cm.  Reproductive: Prostate gland appears enlarged. Symmetric appearance of the seminal vesicles.  Other: Increased presacral soft tissue along the left is identified, image 95/series 2 and is worrisome for tumor.  Musculoskeletal: Large mass involving the sacrum is identified. This measures at least 6.3 cm, image 91/series 2. This is better seen on MRI from 11/04/14  IMPRESSION: 1. Extensive pulmonary metastasis and liver metastasis. 2. Mass involving head of pancreas may represent a focus of metastatic disease or primary pancreatic carcinoma. 3. Bone metastasis are better seen on MRI from 11/04/14 an 11/05/2014. There is a large mass  involving the central portion of the sacrum with associated increased left-sided presacral soft tissue. 4. Aortic atherosclerosis. 5. Infrarenal abdominal aortic aneurysm. Recommend followup by ultrasound in 3 years. This recommendation follows ACR consensus guidelines: White Paper of the ACR Incidental Findings Committee II on Vascular Findings. J Am Coll Radiol 2013; 50:277-412 6. Indeterminate soft tissue attenuating filling defect within the proximal colon may represent neoplasm.   Electronically Signed   By: Kerby Moors M.D.   On: 11/05/2014 15:05   Mr Jeri Cos IN Contrast  11/06/2014   CLINICAL DATA:  69 year old male with recent diagnosis of extensive pulmonary, liver, and bone metastatic disease. Possible brain metastasis on recent cervical spine MRI.  EXAM: MRI HEAD WITHOUT AND WITH CONTRAST  TECHNIQUE: Multiplanar, multiecho pulse sequences of the brain and surrounding structures were obtained without and with intravenous contrast.  CONTRAST:  16mL MULTIHANCE GADOBENATE DIMEGLUMINE 529 MG/ML IV SOLN - INFILTRATED  COMPARISON:  Cervical spine MRI 11/05/2014.  FINDINGS: The patient's intravenous access infiltrated during contrast administration such that no post-contrast imaging of the brain could be obtained. The patient was asymptomatic.  These images demonstrate abnormal T2 and FLAIR hyperintensity in the posterior right temporal lobe encompassing an area of 3 cm (series 6, image 10) with underlying 9-10 mm heterogeneously decreased T2 signal rounded masslike area (series 5, images 9 and 10). Minimal regional mass effect. There is associated hemorrhage (Series 7, image 8) with mild intrinsic T1 and decreased T2 * signal.  No other vasogenic edema or blood products identified in the brain. Scattered small and patchy nonspecific bilateral cerebral white matter T2 and FLAIR hyperintensity. No midline shift. No ventriculomegaly. No restricted diffusion or evidence of acute infarction. Major intracranial  vascular flow voids are within normal limits. Negative noncontrast pituitary, cervicomedullary junction and visualized cervical spine. Visible bone marrow signal is within normal limits. Visible internal auditory structures appear normal. Mild right mastoid effusion. Negative nasopharynx. Negative paranasal sinuses. Orbits soft tissues appear  normal. Visualized scalp soft tissues are within normal limits.  IMPRESSION: 1. The patient's IV access infiltrated during attempted contrast administration. No postcontrast imaging could be obtained. 2. Posterior right temporal lobe lesion is confirmed as suspected on the recent cervical spine MRI. A 2-3 cm area vasogenic edema is present with an underlying small 10 mm mass-like area with blood products. 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 An acute to subacute parenchymal brain hemorrhage (i.e. non neoplastic) is felt much less likely in this setting. 3. No other areas suspicious for metastatic disease on this noncontrast exam.   Electronically Signed   By: Genevie Ann M.D.   On: 11/06/2014 09:30   Mr Cervical Spine Wo Contrast  11/05/2014   CLINICAL DATA:  Metastatic disease. Urinary retention and back pain. Lumbar vertebral lesions consistent with metastatic disease.  EXAM: MRI CERVICAL AND THORACIC SPINE WITHOUT CONTRAST  TECHNIQUE: Multiplanar and multiecho pulse sequences of the cervical spine, to include the craniocervical junction and cervicothoracic junction, and thoracic spine, were obtained without intravenous contrast.  COMPARISON:  MRI lumbar spine 11/04/2014  FINDINGS: MRI CERVICAL SPINE FINDINGS  Patient not able to hold still and the images are degraded by extensive motion. This study is marginally diagnostic with limited information.  Negative for cervical spine fracture. Hemangioma C7 vertebral body. No definite cervical metastatic lesions are identified.  Disc degeneration and spurring is present at C3-4, C4-5, C5-6, and C6-7 causing  spinal stenosis. Axial images contain little diagnostic information due to motion.  Abnormal signal T2 vertebral body on the left compatible with metastatic disease. See below report.  Edema is noted in the right occipital lobe worrisome for metastatic disease. MRI of the brain is recommended for further evaluation.  MRI THORACIC SPINE FINDINGS  Image quality limited by motion. Thoracic spine images are better quality than the cervical spine images.  Abnormal signal in the T2 vertebral body in left compatible with metastatic disease. No epidural tumor or cord compression. No other tumor deposits in the thoracic spine. No cord compression  Mild compression fracture T3 appears chronic and benign. Moderate chronic compression fracture of T8.  T9 lesion is hyperintense on T1 and T2 compatible with hemangioma. Small hemangiomata T4 vertebral body on the right and T7 vertebral body on the right.  Spinal cord signal normal.  Bilateral lung nodules are present. Multiple liver lesions are present. Bibasilar atelectasis in the lung bases.  IMPRESSION: Image quality is degraded by significant motion degrading image quality, especially in the cervical spine.  Cervical spondylosis and spinal stenosis. No definite cervical spine metastatic disease or fracture  Edema in the right occipital lobe worrisome for metastatic disease. MRI of the brain with contrast recommended for further evaluation.  Abnormal signal left T2 vertebral body concerning for metastatic disease. No epidural tumor or cord compression.  Mild chronic compression fractures T3 and moderate to severe chronic compression fracture T8.  Bilateral lung nodules. Multiple liver lesions. These findings are compatible with metastatic disease. Bibasilar atelectasis noted.   Electronically Signed   By: Franchot Gallo M.D.   On: 11/05/2014 09:51   Mr Thoracic Spine Wo Contrast  11/05/2014   CLINICAL DATA:  Metastatic disease. Urinary retention and back pain. Lumbar  vertebral lesions consistent with metastatic disease.  EXAM: MRI CERVICAL AND THORACIC SPINE WITHOUT CONTRAST  TECHNIQUE: Multiplanar and multiecho pulse sequences of the cervical spine, to include the craniocervical junction and cervicothoracic junction, and thoracic spine, were obtained without intravenous contrast.  COMPARISON:  MRI  lumbar spine 11/04/2014  FINDINGS: MRI CERVICAL SPINE FINDINGS  Patient not able to hold still and the images are degraded by extensive motion. This study is marginally diagnostic with limited information.  Negative for cervical spine fracture. Hemangioma C7 vertebral body. No definite cervical metastatic lesions are identified.  Disc degeneration and spurring is present at C3-4, C4-5, C5-6, and C6-7 causing spinal stenosis. Axial images contain little diagnostic information due to motion.  Abnormal signal T2 vertebral body on the left compatible with metastatic disease. See below report.  Edema is noted in the right occipital lobe worrisome for metastatic disease. MRI of the brain is recommended for further evaluation.  MRI THORACIC SPINE FINDINGS  Image quality limited by motion. Thoracic spine images are better quality than the cervical spine images.  Abnormal signal in the T2 vertebral body in left compatible with metastatic disease. No epidural tumor or cord compression. No other tumor deposits in the thoracic spine. No cord compression  Mild compression fracture T3 appears chronic and benign. Moderate chronic compression fracture of T8.  T9 lesion is hyperintense on T1 and T2 compatible with hemangioma. Small hemangiomata T4 vertebral body on the right and T7 vertebral body on the right.  Spinal cord signal normal.  Bilateral lung nodules are present. Multiple liver lesions are present. Bibasilar atelectasis in the lung bases.  IMPRESSION: Image quality is degraded by significant motion degrading image quality, especially in the cervical spine.  Cervical spondylosis and spinal  stenosis. No definite cervical spine metastatic disease or fracture  Edema in the right occipital lobe worrisome for metastatic disease. MRI of the brain with contrast recommended for further evaluation.  Abnormal signal left T2 vertebral body concerning for metastatic disease. No epidural tumor or cord compression.  Mild chronic compression fractures T3 and moderate to severe chronic compression fracture T8.  Bilateral lung nodules. Multiple liver lesions. These findings are compatible with metastatic disease. Bibasilar atelectasis noted.   Electronically Signed   By: Franchot Gallo M.D.   On: 11/05/2014 09:51   Mr Lumbar Spine Wo Contrast  11/05/2014   CLINICAL DATA:  LEFT hip pain radiating to LEFT leg for 3 weeks, no injury. History of urinary retention, lower extremity swelling. Assess sciatica.  EXAM: MRI LUMBAR SPINE WITHOUT CONTRAST  TECHNIQUE: Multiplanar, multisequence MR imaging of the lumbar spine was performed. No intravenous contrast was administered.  COMPARISON:  None.  FINDINGS: Using the reference level of the last well-formed intervertebral disc as L5-S1, low T1, bright STIR signal suspicious lesions spans the L5 vertebral body. Abnormal expansile signal throughout the sacrum, with at least 18 mm presacral soft tissue component/tumor. Low signal probable tumor extends into the LEFT neural foramen of the sacrum, partially imaged on the sacrum. No pathologic fracture. The lumbar vertebral bodies are intact and aligned, maintenance of lumbar lordosis. Status post LEFT L4-5 hemilaminectomy. Severe L4-5 disc height loss, moderate at L2-3 with decreased T2 signal within all lumbar disc consistent with moderate desiccation. Moderate chronic discogenic endplate changes J6-9, C7-8. Acute L3 superior endplate Schmorl's node. Scattered chronic Schmorl's nodes.  Conus medullaris terminates at L1 appears normal in morphology and signal characteristics. Cauda equina is normal in appearance. T2 bright probable  cysts in the kidneys bilaterally, incompletely imaged. 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  Level by level evaluation:  T12-L1: No disc bulge, canal stenosis nor neural foraminal narrowing.  L1-2: Annular bulging asymmetric to LEFT. Mild facet arthropathy and ligamentum flavum redundancy without canal stenosis or neural foraminal narrowing.  L2-3:  Broad-based disc bulge and central disc protrusion in total measures 6 mm. Mild facet arthropathy and ligamentum flavum redundancy. Moderate canal stenosis. Mild LEFT neural foraminal narrowing.  L3-4: Annular bulging. Mild facet arthropathy and ligamentum flavum redundancy without canal stenosis. Mild to moderate RIGHT, mild LEFT neural foraminal narrowing.  L4-5: Status post LEFT hemilaminectomy. Small broad-based disc bulge, moderate facet arthropathy and RIGHT ligamentum flavum redundancy result in moderate canal stenosis. Moderate to severe RIGHT, mild to moderate LEFT neural foraminal narrowing.  L5-S1: Low signal tumor within the ventral epidural space from lower L5 into the sacrum resulting in mild canal stenosis, effacing the LEFT lateral recess which likely affects the traversing LEFT S1 nerve. Mild RIGHT neural foraminal narrowing.  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 No pathologic fracture. Tumor extends into the LEFT presacral space, incompletely characterized.  Moderate canal stenosis at L2-3 and L4-5. Status post LEFT L4-5 hemilaminectomy.  Neural foraminal narrowing L2-3 through L5-S1: Moderate to severe on the RIGHT at L4-5.  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 Recommend followup by ultrasound in 3 years. This recommendation follows ACR consensus guidelines: White Paper of the ACR Incidental Findings Committee II on Vascular Findings. J Am Coll Radiol 2013; 10:789-794   Electronically Signed   By: Elon Alas M.D.   On: 11/05/2014 00:16   US  Biopsy  11/07/2014   CLINICAL DATA:  69 year old male with extensive multifocal metastatic disease of unknown primary. There is a mass in the region of the pancreatic head which could be the primary lesion or an additional area of metastatic adenopathy. Liver lesions are most accessible for safe percutaneous biopsy.  EXAM: ULTRASOUND BIOPSY CORE LIVER  Date: 11/07/2014  PROCEDURE: 1. Ultrasound-guided core biopsy of mass in the left hepatic lobe. Interventional Radiologist:  Criselda Peaches, MD  ANESTHESIA/SEDATION: Moderate (conscious) sedation was used. 1 mg Versed, 50 mcg Fentanyl were administered intravenously. The patient's vital signs were monitored continuously by radiology nursing throughout the procedure.  Sedation Time: 13 minutes  MEDICATIONS: None additional  TECHNIQUE: Informed consent was obtained from the patient following explanation of the procedure, risks, benefits and alternatives. The patient understands, agrees and consents for the procedure. All questions were addressed. A time out was performed.  The right upper quadrant was interrogated with ultrasound. Numerous hepatic lesions are identified. And approximately 6.7 cm mass is identified in the posterior superior aspect of the left hepatic lobe. This presents a suitable lesion for biopsy. An accessible skin entry site was selected and marked.  The region was then sterilely prepped and draped in standard fashion using chlorhexidine skin prep. Local anesthesia was attained by infiltration with 1% lidocaine. A small dermatotomy was made. Under real-time sonographic guidance, a 17 gauge introducer needle was advanced into the margin of the mass. Multiple 18 gauge core biopsies were then coaxially obtained with the bio Pince automated biopsy device.  As the introducer needle was removed the biopsy tract was embolized with a Gel-Foam slurry. The patient tolerated the procedure well.  Biopsy specimens were placed in formalin and delivered to  pathology for further analysis.  COMPLICATIONS: None  IMPRESSION: Technically successful ultrasound-guided core biopsy of left hepatic mass.  Signed,  Criselda Peaches, MD  [facility]  [facility]   Electronically Signed   By: Jacqulynn Cadet M.D.   On: 11/07/2014 08:11   Dg Chest Port 1 View  11/04/2014  CLINICAL DATA:  69 year old male with shortness of breath on exertion. No chest pain.  EXAM: PORTABLE CHEST - 1 VIEW  COMPARISON:  Chest x-ray 01/16/2001.  FINDINGS: There are numerous pulmonary nodules and masses throughout the lungs bilaterally, highly concerning for widespread metastatic disease. Largest of these measure up to 4 cm in the right upper lobe. No definite pleural effusions. No evidence of pulmonary edema. Heart size is normal. Upper mediastinal contours are within normal limits. Atherosclerosis in the thoracic aorta.  IMPRESSION: 1. Findings, as above, highly concerning for widespread metastatic disease to the lungs. Further evaluation with contrast enhanced chest CT is recommended at this time. 2. Atherosclerosis.   Electronically Signed   By: Vinnie Langton M.D.   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  He has loss of bowel and bladder function currently. He has no history of cancer and biopsy is pending to confirm.  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  He may benefit from palliative radiotherapy to the sacrum for possible improvement in bowel and bladder function.  His prognosis appears poor, but, should be somewhat more clear based on pathology and treatment options.  PLAN: Today, I talked to the patient and family about the findings and work-up thus far.  We discussed the natural history of spinal metastases and brain metastases and general treatment, highlighting the role of  radiotherapy in the management.  We discussed the available radiation techniques, and focused on the details of logistics and delivery.  We reviewed the anticipated acute and late sequelae associated with radiation in this setting.  The patient was encouraged to ask questions that I answered to the best of my ability.   In anticipation of positive biopsy, I would consider transfer to [facility] for radiation therapy.  Once we have established a diagnosis and considered the potential options for treatment, we would also want to collaborate with palliative care for symptom management and goals of care.  I spent more than 50% of my visit in counseling and/or coordination of care.     ------------------------------------------------  Sheral Apley. Tammi Klippel, M.D.

## 2014-11-08 NOTE — Progress Notes (Signed)
[place] TEAM 1 - Stepdown/ICU TEAM Progress Note  Stuart Vaughan BJS:[id number] DOB: 02/03/1946 DOA: 11/04/2014 PCP: Maryella Shivers, MD  Admit HPI / Brief Narrative: 69 year old M Hx hypertension and hyperlipidemia who had been experiencing increasing low back pain over several weeks. He went to the emergency room because of urinary retention and had a Foley catheter placed. He followed up with a Urologist the day prior to his admit who took the catheter out but patient still could not urinate and the catheter had to be replaced. He later returned to the emergency room c/o shortness of breath and was found to be hypotensive and tachycardic with a hemoglobin around 8 with heme positive stool. A chest x-ray suggested metastatic disease. An MRI of the spine revealed a lesion in the lumbar region.   Of note the patient had an EGD with dilation of an esophageal stricture 4 days prior to this admit. He also had an esophageal ulcer biopsied which was negative. He has never had a colonoscopy.  HPI/Subjective: Pt has noted the rapid accumulation of edema in his legs, L>R.  He denies pain in the legs.  He denies cp, sob, n/v, or abdom pain.    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, pancreatic mass, sacral T2 and brain mets, and soft tissue defect in proximal colon  -Oncology following - s/p liver bx with results pending - palliative XRT being considered for bone/brain mets - awaiting biopsy results  Acute respiratory failure with hypoxia / COPD / CAP  multifactorial to include COPD from previous history of smoking + metastatic cancer unknown primary - continue empiric antibiotics for planned short course of tx (5 days - to end today) - wean O2 as able   Elevated d-dimer Unable to anticoagulate due to acute GI bleed and therefore further evaluation with contrasted CT not pursued  New B LE edema L>R  Check dopplers to r/o DVT - TED hose - stop IVF    Posterior right temporal lobe lesion w/ 2-3 cm area vasogenic edema  underlying lesion size 10 mm  Acute GI bleed -GI has evaluated patient and feels bleeding most likely consistent with recent EGD with dilation and esophageal ulcer diagnosed 4 days ago - do not believe further workup warranted - hemoglobin is stable - cont PPI BID   Low back pain with radiation to the left lower extremity  -Secondary to metastatic cancer unknown origin in the L-spine area with nerve root impingement and invasion into the sacrum  -Decadron continues - changed dose to BID for convenience   Acute blood loss anemia +/- chronic malignancy associated anemia  -baseline hemoglobin is not known - transfused 2 units PRBC thus far - hemoglobin stable  Hyponatremia  -Resolved w/ volume expansion but now fluctuating - follow  Metabolic acidosis  -Due to acute renal failure - essentially resolved   Acute renal failure with urinary retention  -w/ foley and hydration crt appears to have stabilized at approximately 1.4 - will not attempt to remove Foley catheter  Extravasation of MRI contrast right anterior forearm  -Benign exam   Steroid induced hyperglycemia  -CBG remains elevated - adjust tx again - follow trend  Code Status: FULL Family Communication: Spoke with patient and wife at bedside Disposition Plan: awaiting bx results to begin tx   Consultants: [facility]  Procedure/Significant Events: 6/8 PCXR review of film shows patient with multiple bilateral large well-circumscribed nodules; metastatic carcinoma? 6/9 MRI L-spine without contrast; -  L5 and sacral metastasis with tumor in the ventral sacral epidural space; inserting for nerve root impingement LEFT S1 nerve. -Tumor extends into the LEFT presacral space, -Moderate canal stenosis at L2-3 and L4-5. Status post LEFT L4-5 hemilaminectomy. -Neural foraminal narrowing L2-3 through L5-S1: Moderate to severe on the RIGHT at L4-5. -3.1  cm infrarenal aorta aneurysm 6/9 C-spine/T-spine MRI without contrast; -Edema in the right occipital lobe;metastatic disease?. -Left T2 vertebral body concerning for metastatic disease.  -Mild chronic compression fractures T3 -moderate to severe chronic compression fracture T8. -Bilateral lung nodules. Multiple liver lesions, C/W metastatic disease.  6/9Transfused 2 units PRBC  Antibiotics: Zosyn 6/8 > 6/12 Vancomycin 6/8 > 6/12  DVT prophylaxis: SCDs  Objective: Blood pressure 126/77, pulse 82, temperature 98.4 F (36.9 C), temperature source Oral, resp. rate 20, height 5\' 5"  (1.651 m), weight 62.415 kg (137 lb 9.6 oz), SpO2 92 %.  Intake/Output Summary (Last 24 hours) at 11/08/14 1544 Last data filed at 11/08/14 1506  Gross per 24 hour  Intake    880 ml  Output   2175 ml  Net  -1295 ml   Exam: General: No acute respiratory distress - alert and conversant  Lungs: Clear to auscultation bilaterally without wheeze or crackles Cardiovascular: Regular rate and rhythm without murmur gallop or rub  Abdomen: Nontender, nondistended, soft, bowel sounds positive, no rebound, no ascites, no appreciable mass Extremities: No significant cyanosis or clubbing, 3+ edema L LE and 2+ R LE   Data Reviewed: Basic Metabolic Panel:  Recent Labs Lab 11/04/14 1936 11/05/14 0522 11/06/14 0232 11/07/14 0245 11/08/14 0357  NA 125* 129* 128* 135 133*  K 3.8 3.9 3.9 3.9 4.4  CL 92* 100* 100* 105 103  CO2 19* 18* 18* 20* 20*  GLUCOSE 131* 157* 394* 267* 207*  BUN 39* 30* 23* 21* 23*  CREATININE 2.04* 1.57* 1.37* 1.42* 1.38*  CALCIUM 8.1* 7.5* 7.6* 8.0* 7.9*  MG  --   --  1.8  --   --    Liver Function Tests:  Recent Labs Lab 11/05/14 0135 11/05/14 0522 11/06/14 0232 11/07/14 0245 11/08/14 0357  AST 161* 151* 133* 121* 24  ALT 85* 80* 80* 80* 76*  ALKPHOS 228* 214* 265* 247* 230*  BILITOT 2.2* 2.0* 2.7* 1.3* 1.1  PROT 5.2* 4.8* 4.9* 4.9* 4.7*  ALBUMIN 2.3* 2.2* 2.0* 2.0* 2.0*    CBC:  Recent Labs Lab 11/05/14 0522 11/05/14 1155 11/06/14 0232 11/07/14 0245 11/08/14 0357  WBC 10.1 12.6* 10.7* 13.5* 12.7*  NEUTROABS  --   --  9.7*  --   --   HGB 7.5* 7.7* 9.4* 9.3* 9.5*  HCT 21.8* 22.5* 27.7* 27.3* 27.9*  MCV 91.6 92.2 88.5 88.9 88.9  PLT 209 218 216 214 186   Cardiac Enzymes:  Recent Labs Lab 11/05/14 0135 11/05/14 1011 11/05/14 1655  TROPONINI <0.03 <0.03 <0.03   CBG:  Recent Labs Lab 11/07/14 1151 11/07/14 1744 11/07/14 2110 11/08/14 0805 11/08/14 1149  GLUCAP 248* 304* 259* 164* 368*    Recent Results (from the past 240 hour(s))  MRSA PCR Screening     Status: None   Collection Time: 11/05/14  1:05 AM  Result Value Ref Range Status   MRSA by PCR NEGATIVE NEGATIVE Final    Comment:        The GeneXpert MRSA Assay (FDA approved for NASAL specimens only), is one component of a comprehensive MRSA colonization surveillance program. It is not intended to diagnose MRSA infection nor to guide  or monitor treatment for MRSA infections.   Culture, blood (routine x 2)     Status: None (Preliminary result)   Collection Time: 11/05/14  3:12 AM  Result Value Ref Range Status   Specimen Description BLOOD RIGHT HAND  Final   Special Requests BOTTLES DRAWN AEROBIC ONLY 4CC  Final   Culture   Final           BLOOD CULTURE RECEIVED NO GROWTH TO DATE CULTURE WILL BE HELD FOR 5 DAYS BEFORE ISSUING A FINAL NEGATIVE REPORT Performed at [facility]    Report Status PENDING  Incomplete  Culture, blood (routine x 2)     Status: None (Preliminary result)   Collection Time: 11/05/14  3:17 AM  Result Value Ref Range Status   Specimen Description BLOOD LEFT HAND  Final   Special Requests BOTTLES DRAWN AEROBIC AND ANAEROBIC 10CC EA  Final   Culture   Final           BLOOD CULTURE RECEIVED NO GROWTH TO DATE CULTURE WILL BE HELD FOR 5 DAYS BEFORE ISSUING A FINAL NEGATIVE REPORT Performed at [facility]    Report Status PENDING   Incomplete  Clostridium Difficile by PCR (not at [facility])     Status: None   Collection Time: 11/05/14 11:01 AM  Result Value Ref Range Status   C difficile by pcr NEGATIVE NEGATIVE Final     Studies:  Recent x-ray studies have been reviewed in detail by the Attending Physician  Scheduled Meds:  Scheduled Meds: . dexamethasone  8 mg Oral Q12H  . dextromethorphan-guaiFENesin  1 tablet Oral BID  . insulin aspart  0-15 Units Subcutaneous TID WC  . insulin aspart  0-5 Units Subcutaneous QHS  . insulin glargine  14 Units Subcutaneous QHS  . ipratropium-albuterol  3 mL Nebulization TID  . pantoprazole  40 mg Oral BID  . piperacillin-tazobactam (ZOSYN)  IV  3.375 g Intravenous Q8H  . vancomycin  1,000 mg Intravenous Q24H    Time spent on care of this patient: 35 mins  Cherene Altes, MD [facility] For Consults/Admissions - Flow Manager - [phone] Office  [phone]  Contact MD directly via text page:      [email or web address]      password [facility]  11/08/2014, 3:44 PM   LOS: 4 days

## 2014-11-08 NOTE — Progress Notes (Signed)
Texted paged MD with positive preliminary doppler results. Awaiting response.

## 2014-11-08 NOTE — Progress Notes (Signed)
  [place] TEAM 1 - Stepdown/ICU TEAM  Venous duplex of the lower extremity has confirmed DVT in the left gastrocnemius vein.  The patient has not been all pharmacologic prophylaxis because of a recent GI bleed.  This bleed was related to a recent esophageal stricture dilatation and esophageal ulcer, with the procedure occurring 10/31/2014.  We are now 8 days post this procedure.  The patient's hemoglobin has been steady/improving and there's been no evidence of ongoing active bleeding.  This patient is very high risk for clot propagation and embolization given his current cancer and restricted mobility due to lumbar metastases.  It is my feeling at present that anticoagulation with Lovenox, though high risk, is warranted as the likelihood of him suffering a life ending embolic event is even higher.  I will initiate full dose Lovenox while the patient is in a controlled environment and we will monitor CBCs on a every 12 hours basis.  Cherene Altes, MD [facility] For Consults/Admissions - Flow Manager [phone] Office  [phone]  Contact MD directly via text page:      [email or web address]      password [facility]

## 2014-11-09 LAB — CBC
HCT: 28.8 % — ABNORMAL LOW (ref 39.0–52.0)
HCT: 31.5 % — ABNORMAL LOW (ref 39.0–52.0)
Hemoglobin: 9.9 g/dL — ABNORMAL LOW (ref 13.0–17.0)
Hemoglobin: 10.6 g/dL — ABNORMAL LOW (ref 13.0–17.0)
MCH: 31.2 pg (ref 26.0–34.0)
MCH: 30.4 pg (ref 26.0–34.0)
MCHC: 34.4 g/dL (ref 30.0–36.0)
MCHC: 33.7 g/dL (ref 30.0–36.0)
MCV: 90.9 fL (ref 78.0–100.0)
MCV: 90.3 fL (ref 78.0–100.0)
PLATELETS: 164 10*3/uL (ref 150–400)
PLATELETS: 183 10*3/uL (ref 150–400)
RBC: 3.17 MIL/uL — ABNORMAL LOW (ref 4.22–5.81)
RBC: 3.49 MIL/uL — AB (ref 4.22–5.81)
RDW: 14.9 % (ref 11.5–15.5)
RDW: 15 % (ref 11.5–15.5)
WBC: 12.1 10*3/uL — AB (ref 4.0–10.5)
WBC: 14.8 10*3/uL — AB (ref 4.0–10.5)

## 2014-11-09 LAB — HEMOGLOBIN A1C
Hgb A1c MFr Bld: 8.2 % — ABNORMAL HIGH (ref 4.8–5.6)
MEAN PLASMA GLUCOSE: 189 mg/dL

## 2014-11-09 LAB — GLUCOSE, CAPILLARY
GLUCOSE-CAPILLARY: 319 mg/dL — AB (ref 65–99)
GLUCOSE-CAPILLARY: 171 mg/dL — AB (ref 65–99)
GLUCOSE-CAPILLARY: 109 mg/dL — AB (ref 65–99)
Glucose-Capillary: 256 mg/dL — ABNORMAL HIGH (ref 65–99)
Glucose-Capillary: 184 mg/dL — ABNORMAL HIGH (ref 65–99)

## 2014-11-09 NOTE — Progress Notes (Signed)
Pt was referred to Chaplain by nurse. Pt has a diagnosis of life limiting illness. Pt is thought to have denial of diagnosis. Pt has support from family. Wife and daughter were in the room with Pt . Chaplain saked if they would like to talked or to have prayer or both. Pt's wife asked for prayer. During conversation Chaplain noticed that there was a tear in the eyes of the Pt. The Pt quickly regain his composure. Chaplain prayed for Pt and his family. Chaplain  spoke with nurse believe the Pt will open up when he is alone. Chaplain will follow up.   11/09/14 1000  Clinical Encounter Type  Visited With Patient and family together  Visit Type Spiritual support  Referral From Nurse  Spiritual Encounters  Spiritual Needs Prayer;Emotional  Stress Factors  Patient Stress Factors Major life changes  Family Stress Factors Major life changes

## 2014-11-09 NOTE — Care Management Note (Signed)
Case Management Note  Patient Details  Name: Stuart Vaughan MRN: [id number] Date of Birth: [birth year]  Subjective/Objective:     Pt admitted on 11/04/14 with back pain, UGI bleed; found to have metastatic cancer.  PTA, pt independent, lives with spouse.                  Action/Plan: Will follow for discharge planning as pt progresses.  May transfer to [facility] for radiation tx.    Expected Discharge Date:     11/10/14             Expected Discharge Plan:  Acute to Acute Transfer  In-House Referral:     Discharge planning Services  CM Consult  Post Acute Care Choice:    Choice offered to:     DME Arranged:    DME Agency:     HH Arranged:    HH Agency:     Status of Service:  In process, will continue to follow  Medicare Important Message Given:  Yes Date Medicare IM Given:  11/09/14 Medicare IM give by:  Ellan Lambert, RN, BSN  Date Additional Medicare IM Given:    Additional Medicare Important Message give by:     If discussed at [place] of Stay Meetings, dates discussed:    Additional Comments:  Reinaldo Raddle, RN, BSN  Trauma/Neuro ICU Case Manager [phone]

## 2014-11-09 NOTE — Progress Notes (Signed)
[facility] TEAM 1 - Stepdown/ICU TEAM Progress Note  TAGG EUSTICE SWF:[id number] DOB: November 16, 1945 DOA: 11/04/2014 PCP: Maryella Shivers, MD  Admit HPI / Brief Narrative: 69 year old M Hx hypertension and hyperlipidemia who had been experiencing increasing low back pain over several weeks. He went to the emergency room because of urinary retention and had a Foley catheter placed. He followed up with a Urologist the day prior to his admit who took the catheter out but patient still could not urinate and the catheter had to be replaced. He later returned to the emergency room c/o shortness of breath and was found to be hypotensive and tachycardic with a hemoglobin around 8 with heme positive stool. A chest x-ray suggested metastatic disease. An MRI of the spine revealed a lesion in the lumbar region.   Of note the patient had an EGD with dilation of an esophageal stricture 4 days prior to this admit. He also had an esophageal ulcer biopsied which was negative. He has never had a colonoscopy.  HPI/Subjective: Resting comfortably in bed at the present time.  Patient is annoyed by inability to predict or control his bowel movements.  His Foley catheter remains in place.  He is tolerating Lovenox without any difficulty and thus far there's been no evidence of recurrent bleeding.  He denies chest pain nausea vomiting or abdominal pain.  The pathology results from his recent liver biopsy are not yet available.  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, pancreatic mass, sacral T2 and brain mets, and soft tissue defect in proximal colon  Oncology following - s/p liver bx with results still pending - palliative XRT being considered for bone/brain mets - awaiting biopsy results - once bx resulted will likely proceed w/ transfer to [facility] to begin XRT - Med Onc and Rad Onc following w/ [place]  Acute respiratory failure with hypoxia / COPD / CAP  multifactorial to include COPD  from previous history of smoking + metastatic cancer unknown primary - has completed a 5 day course of empiric antibiotics therapy - wean O2 as able   Left gastrocnemius vein DVT Swelling markedly improved today - Lovenox therapy initiated - thus far tolerating without evidence of recurrent GI bleeding - continue to follow every 12 hours CBC for initial 72 hours of Lovenox therapy  Posterior right temporal lobe lesion w/ 2-3 cm area vasogenic edema  underlying lesion size 10 mm - Decadron dosing continues  Acute GI bleed GI has evaluated patient and feels bleeding most likely consistent with recent EGD with dilation and esophageal ulcer diagnosed 4 days prior to admission - do not believe further workup warranted - hemoglobin is stable - cont PPI BID - added prophylactic Carafate with need to anticoagulate   Low back pain with radiation to the left lower extremity  Secondary to metastatic cancer unknown origin in the L-spine area with nerve root impingement and invasion into the sacrum - Decadron continues - continue Foley catheter for urinary retention  Acute blood loss anemia +/- chronic malignancy associated anemia  baseline hemoglobin is not known - transfused 2 units PRBC earlier in hospital stay - hemoglobin stable at this time despite full dose Lovenox  Hyponatremia  -Resolved w/ volume expansion but now fluctuating - follow  Metabolic acidosis  -Due to acute renal failure - essentially resolved   Acute renal failure with urinary retention  w/ foley and hydration crt appears to have stabilized at approximately 1.4 - will not attempt to remove Foley  catheter  Extravasation of MRI contrast right anterior forearm  Benign exam - resolved   Steroid induced hyperglycemia  -CBGs erratic - adjust treatment again and follow  Code Status: FULL Family Communication: Spoke with patient and wife at bedside Disposition Plan: awaiting bx results to begin tx - anticipate transfer to [facility] possibly as early as 6/14 for XRT  Consultants: [facility] Radiation Oncology  Procedure/Significant Events: 6/8 PCXR review of film shows patient with multiple bilateral large well-circumscribed nodules; metastatic carcinoma? 6/9 MRI L-spine without contrast; -L5 and sacral metastasis with tumor in the ventral sacral epidural space; inserting for nerve root impingement LEFT S1 nerve. -Tumor extends into the LEFT presacral space, -Moderate canal stenosis at L2-3 and L4-5. Status post LEFT L4-5 hemilaminectomy. -Neural foraminal narrowing L2-3 through L5-S1: Moderate to severe on the RIGHT at L4-5. 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 -Left T2 vertebral body concerning for metastatic disease.  -Mild chronic compression fractures T3 -moderate to severe chronic compression fracture T8. -Bilateral lung nodules. Multiple liver lesions, C/W metastatic disease.  6/9Transfused 2 units PRBC  Antibiotics: Zosyn 6/8 > 6/12 Vancomycin 6/8 > 6/12  DVT prophylaxis: Lovenox  Objective: Blood pressure 151/91, pulse 107, temperature 97.5 F (36.4 C), temperature source Oral, resp. rate 20, height 5\' 5"  (1.651 m), weight 76.204 kg (168 lb), SpO2 100 %.  Intake/Output Summary (Last 24 hours) at 11/09/14 1534 Last data filed at 11/09/14 1512  Gross per 24 hour  Intake    580 ml  Output   2875 ml  Net  -2295 ml   Exam: General: No acute respiratory distress - alert and conversant  Lungs: Clear to auscultation bilaterally without wheeze / crackles Cardiovascular: Regular rate and rhythm without murmur gallop rub  Abdomen: Nontender, nondistended, soft, bowel sounds positive, no rebound, no ascites, no appreciable mass Extremities: No significant cyanosis or clubbing, trace edema B LE    Data Reviewed: Basic Metabolic Panel:  Recent Labs Lab 11/04/14 1936 11/05/14 0522  11/06/14 0232 11/07/14 0245 11/08/14 0357  NA 125* 129* 128* 135 133*  K 3.8 3.9 3.9 3.9 4.4  CL 92* 100* 100* 105 103  CO2 19* 18* 18* 20* 20*  GLUCOSE 131* 157* 394* 267* 207*  BUN 39* 30* 23* 21* 23*  CREATININE 2.04* 1.57* 1.37* 1.42* 1.38*  CALCIUM 8.1* 7.5* 7.6* 8.0* 7.9*  MG  --   --  1.8  --   --    Liver Function Tests:  Recent Labs Lab 11/05/14 0135 11/05/14 0522 11/06/14 0232 11/07/14 0245 11/08/14 0357  AST 161* 151* 133* 121* 24  ALT 85* 80* 80* 80* 76*  ALKPHOS 228* 214* 265* 247* 230*  BILITOT 2.2* 2.0* 2.7* 1.3* 1.1  PROT 5.2* 4.8* 4.9* 4.9* 4.7*  ALBUMIN 2.3* 2.2* 2.0* 2.0* 2.0*   CBC:  Recent Labs Lab 11/06/14 0232 11/07/14 0245 11/08/14 0357 11/08/14 1830 11/09/14 0805  WBC 10.7* 13.5* 12.7* 15.5* 12.1*  NEUTROABS 9.7*  --   --   --   --   HGB 9.4* 9.3* 9.5* 10.6* 9.9*  HCT 27.7* 27.3* 27.9* 30.9* 28.8*  MCV 88.5 88.9 88.9 89.8 90.9  PLT 216 214 186 171 164   Cardiac Enzymes:  Recent Labs Lab 11/05/14 0135 11/05/14 1011 11/05/14 1655  TROPONINI <0.03 <0.03 <0.03   CBG:  Recent Labs Lab 11/08/14 1149 11/08/14 1727 11/08/14 2216 11/09/14 0750 11/09/14 1156  GLUCAP 368* 162*  256* 184* 319*    Recent Results (from the past 240 hour(s))  MRSA PCR Screening     Status: None   Collection Time: 11/05/14  1:05 AM  Result Value Ref Range Status   MRSA by PCR NEGATIVE NEGATIVE Final    Comment:        The GeneXpert MRSA Assay (FDA approved for NASAL specimens only), is one component of a comprehensive MRSA colonization surveillance program. It is not intended to diagnose MRSA infection nor to guide or monitor treatment for MRSA infections.   Culture, blood (routine x 2)     Status: None (Preliminary result)   Collection Time: 11/05/14  3:12 AM  Result Value Ref Range Status   Specimen Description BLOOD RIGHT HAND  Final   Special Requests BOTTLES DRAWN AEROBIC ONLY 4CC  Final   Culture   Final           BLOOD CULTURE  RECEIVED NO GROWTH TO DATE CULTURE WILL BE HELD FOR 5 DAYS BEFORE ISSUING A FINAL NEGATIVE REPORT Performed at [facility]    Report Status PENDING  Incomplete  Culture, blood (routine x 2)     Status: None (Preliminary result)   Collection Time: 11/05/14  3:17 AM  Result Value Ref Range Status   Specimen Description BLOOD LEFT HAND  Final   Special Requests BOTTLES DRAWN AEROBIC AND ANAEROBIC 10CC EA  Final   Culture   Final           BLOOD CULTURE RECEIVED NO GROWTH TO DATE CULTURE WILL BE HELD FOR 5 DAYS BEFORE ISSUING A FINAL NEGATIVE REPORT Performed at [facility]    Report Status PENDING  Incomplete  Clostridium Difficile by PCR (not at [facility])     Status: None   Collection Time: 11/05/14 11:01 AM  Result Value Ref Range Status   C difficile by pcr NEGATIVE NEGATIVE Final     Studies:  Recent x-ray studies have been reviewed in detail by the Attending Physician  Scheduled Meds:  Scheduled Meds: . dexamethasone  8 mg Oral Q12H  . dextromethorphan-guaiFENesin  1 tablet Oral BID  . enoxaparin (LOVENOX) injection  1 mg/kg Subcutaneous Q12H  . insulin aspart  0-20 Units Subcutaneous TID WC  . insulin aspart  0-5 Units Subcutaneous QHS  . insulin glargine  18 Units Subcutaneous QHS  . ipratropium-albuterol  3 mL Nebulization TID  . pantoprazole  40 mg Oral BID  . sucralfate  1 g Oral TID WC & HS    Time spent on care of this patient: 25 mins  Cherene Altes, MD [facility] For Consults/Admissions - Flow Manager - [phone] Office  [phone]  Contact MD directly via text page:      [email or web address]      password [facility]  11/09/2014, 3:34 PM   LOS: 5 days

## 2014-11-09 NOTE — Progress Notes (Signed)
IP PROGRESS NOTE  Subjective:    Stuart Vaughan is doing reasonably well this morning. He is complaining of some cough but no shortness of breath. He does not have any new neurological complaints. He is not reporting any headaches or blurry vision. He was able to ambulate and spent some time in a chair. He still have a Foley catheter in place.  Objective:  Vital signs in last 24 hours: Temp:  [98 F (36.7 C)-98.9 F (37.2 C)] 98.9 F (37.2 C) (06/13 0556) Pulse Rate:  [82-93] 93 (06/13 0556) Resp:  [20-23] 20 (06/13 0556) BP: (126-132)/(69-80) 131/80 mmHg (06/13 0556) SpO2:  [92 %-97 %] 97 % (06/13 0556) Weight:  [168 lb (76.204 kg)] 168 lb (76.204 kg) (06/13 0556) Weight change:  Last BM Date: 11/08/14  Intake/Output from previous day: 06/12 0701 - 06/13 0700 In: 1100 [P.O.:840; IV Piggyback:100] Out: 2300 [Urine:2300]  Mouth: mucous membranes moist, pharynx normal without lesions Resp: wheezes bilaterally Cardio: regular rate and rhythm, S1, S2 normal, no murmur, click, rub or gallop GI: soft, non-tender; bowel sounds normal; no masses,  no organomegaly Extremities: extremities normal, atraumatic, no cyanosis or edema Neurological: No deficits noted.   Lab Results:  Recent Labs  11/08/14 0357 11/08/14 1830  WBC 12.7* 15.5*  HGB 9.5* 10.6*  HCT 27.9* 30.9*  PLT 186 171    BMET  Recent Labs  11/07/14 0245 11/08/14 0357  NA 135 133*  K 3.9 4.4  CL 105 103  CO2 20* 20*  GLUCOSE 267* 207*  BUN 21* 23*  CREATININE 1.42* 1.38*  CALCIUM 8.0* 7.9*    Studies/Results:    Stuart Vaughan Contrast  11/06/2014   CLINICAL DATA:  69 year old male with recent diagnosis of extensive pulmonary, liver, and bone metastatic disease. Possible brain metastasis on recent cervical spine MRI.  EXAM: MRI HEAD WITHOUT AND WITH CONTRAST  TECHNIQUE: Multiplanar, multiecho pulse sequences of the brain and surrounding structures were obtained without and with intravenous contrast.   CONTRAST:  11mL MULTIHANCE GADOBENATE DIMEGLUMINE 529 MG/ML IV SOLN - INFILTRATED  COMPARISON:  Cervical spine MRI 11/05/2014.  FINDINGS: The patient's intravenous access infiltrated during contrast administration such that no post-contrast imaging of the brain could be obtained. The patient was asymptomatic.  These images demonstrate abnormal T2 and FLAIR hyperintensity in the posterior right temporal lobe encompassing an area of 3 cm (series 6, image 10) with underlying 9-10 mm heterogeneously decreased T2 signal rounded masslike area (series 5, images 9 and 10). Minimal regional mass effect. There is associated hemorrhage (Series 7, image 8) with mild intrinsic T1 and decreased T2 * signal.  No other vasogenic edema or blood products identified in the brain. Scattered small and patchy nonspecific bilateral cerebral white matter T2 and FLAIR hyperintensity. No midline shift. No ventriculomegaly. No restricted diffusion or evidence of acute infarction. Major intracranial vascular flow voids are within normal limits. Negative noncontrast pituitary, cervicomedullary junction and visualized cervical spine. Visible bone marrow signal is within normal limits. Visible internal auditory structures appear normal. Mild right mastoid effusion. Negative nasopharynx. Negative paranasal sinuses. Orbits soft tissues appear normal. Visualized scalp soft tissues are within normal limits.  IMPRESSION: 1. The patient's IV access infiltrated during attempted contrast administration. No postcontrast imaging could be obtained. 2. Posterior right temporal lobe lesion is confirmed as suspected on the recent cervical spine MRI. A 2-3 cm area vasogenic edema is present with an underlying small 10 mm mass-like area with blood products. Favor a small hypervascular brain  metastasis in this setting. An acute to subacute parenchymal brain hemorrhage (i.e. non neoplastic) is felt much less likely in this setting. 3. No other areas suspicious for  metastatic disease on this noncontrast exam.   Electronically Signed   By: Genevie Ann M.D.   On: 11/06/2014 09:30            Medications: I have reviewed the patient's current medications.  Assessment/Plan:  69 year old with:   1. Metastatic malignancy of unknown primary. The differential diagnosis include GI malignancy versus lung cancer but could be really any other solid tumor. Biopsy results are still pending. He is a marginal candidate for systemic therapy.  2. Sacral metastasis and brain metastasis: He would benefit from palliative radiation therapy. Dr. Tammi Klippel is evaluating the patient for possible radiation therapy.  I agree with dexamethasone as you are doing.   3. Anemia: This is related to malignancy versus GI blood loss. Hemoglobin is relatively stable.  4. Prognosis: Will be dictated by the pathology but likely prognosis will be poor regardless. Given the extensive nature of his disease and his debilitated state. He would be a marginal candidate for aggressive therapy.  He understands any treatment will palliative in nature.   5. Disposition: He will need oncology follow up upon discharge. This can be done in [place] closer to his home.        LOS: 5 days   XMIWOE,HOZYY 11/09/2014, 8:07 AM

## 2014-11-10 DIAGNOSIS — M5441 Lumbago with sciatica, right side: Secondary | ICD-10-CM | POA: Insufficient documentation

## 2014-11-10 LAB — GLUCOSE, CAPILLARY
GLUCOSE-CAPILLARY: 161 mg/dL — AB (ref 65–99)
GLUCOSE-CAPILLARY: 180 mg/dL — AB (ref 65–99)
Glucose-Capillary: 101 mg/dL — ABNORMAL HIGH (ref 65–99)
Glucose-Capillary: 138 mg/dL — ABNORMAL HIGH (ref 65–99)

## 2014-11-10 LAB — CBC
HCT: 30.3 % — ABNORMAL LOW (ref 39.0–52.0)
HEMATOCRIT: 31.2 % — AB (ref 39.0–52.0)
HEMOGLOBIN: 10.7 g/dL — AB (ref 13.0–17.0)
HEMOGLOBIN: 10.3 g/dL — AB (ref 13.0–17.0)
MCH: 30.7 pg (ref 26.0–34.0)
MCH: 30.3 pg (ref 26.0–34.0)
MCHC: 34.3 g/dL (ref 30.0–36.0)
MCHC: 34 g/dL (ref 30.0–36.0)
MCV: 89.4 fL (ref 78.0–100.0)
MCV: 89.1 fL (ref 78.0–100.0)
Platelets: 169 10*3/uL (ref 150–400)
Platelets: 137 10*3/uL — ABNORMAL LOW (ref 150–400)
RBC: 3.49 MIL/uL — ABNORMAL LOW (ref 4.22–5.81)
RBC: 3.4 MIL/uL — ABNORMAL LOW (ref 4.22–5.81)
RDW: 14.9 % (ref 11.5–15.5)
RDW: 15 % (ref 11.5–15.5)
WBC: 17.8 10*3/uL — AB (ref 4.0–10.5)
WBC: 14.6 10*3/uL — AB (ref 4.0–10.5)

## 2014-11-10 LAB — COMPREHENSIVE METABOLIC PANEL
ALBUMIN: 2.2 g/dL — AB (ref 3.5–5.0)
ALK PHOS: 262 U/L — AB (ref 38–126)
ALT: 87 U/L — AB (ref 17–63)
AST: 147 U/L — AB (ref 15–41)
Anion gap: 10 (ref 5–15)
BUN: 21 mg/dL — ABNORMAL HIGH (ref 6–20)
CO2: 22 mmol/L (ref 22–32)
Calcium: 8.1 mg/dL — ABNORMAL LOW (ref 8.9–10.3)
Chloride: 102 mmol/L (ref 101–111)
Creatinine, Ser: 1.09 mg/dL (ref 0.61–1.24)
GFR calc Af Amer: >60 mL/min (ref >60)
GFR calc non Af Amer: >60 mL/min (ref >60)
Glucose, Bld: 114 mg/dL — ABNORMAL HIGH (ref 65–99)
POTASSIUM: 3.9 mmol/L (ref 3.5–5.1)
SODIUM: 134 mmol/L — AB (ref 135–145)
Total Bilirubin: 2 mg/dL — ABNORMAL HIGH (ref 0.3–1.2)
Total Protein: 4.8 g/dL — ABNORMAL LOW (ref 6.5–8.1)

## 2014-11-10 LAB — MAGNESIUM: MAGNESIUM: 1.8 mg/dL (ref 1.7–2.4)

## 2014-11-10 NOTE — Progress Notes (Signed)
   Department of Radiation Oncology  Phone:  [phone] Fax:        [phone]   Pathology positive for carcinoma.  Regardless of patient's poor prognosis, he may benefit from a brief course of palliative RT to the sacrum and possibly his brain.  He plans to pursue treatment closer to home in [place]. ________________________________  Sheral Apley. Tammi Klippel, M.D.

## 2014-11-10 NOTE — Progress Notes (Signed)
[employer] TEAM 1 - Stepdown/ICU TEAM Progress Note  Stuart Vaughan VVO:[id number] DOB: 10-Jan-1946 DOA: 11/04/2014 PCP: Maryella Shivers, MD  Admit HPI / Brief Narrative: 68 year old WM PMHx hypertension and hyperlipidemia   Has been experiencing increasing low back pain over the last several weeks. He was diagnosed with sciatica a few weeks ago. He also went to the emergency room because of urinary retention and had a Foley catheter placed. He went back to see the urologist yesterday who took the catheter out but patient still could not urinate and saw the catheter had to be replaced. The patient's wife stated that they said they thought the urinary retention was from the sciatica area he came to our emergency room last night because of shortness of breath and he was found to be somewhat hypotensive and tachycardic. He was found to have a hemoglobin of around 8. He was found to have heme positive stool. A chest x-ray showed metastatic disease. An MRI of the spine shows lesion in the lumbar region. The patient had an EGD with esophageal dilation of an esophageal stricture up to 57 [place] on Monday of this week which was 4 days ago. He also had an esophageal ulcer which was biopsied and the biopsy was negative. He was found to have heme positive stool in the ER here but no melena. He is never had a colonoscopy.  HPI/Subjective: 6/14 patient former smoker states never placed on home O2. States negative back pain, negative CP, negative SOB, positive DOE.   Assessment/Plan: Acute respiratory failure with hypoxia/COPD/CAP  -Most likely multifactorial to include COPD from previous history of smoking, metastatic cancer unknown primary, CAP?, PE?. -Although patient never officially diagnosed with COPD, was former heavy smoker (50+-pack-year) and findings consistent with COPD - DuoNeb TID  -Albuterol PRN  -Completed a 5 day course of empiric antibiotics therapy -Ambulatory SPO2; will obtain values and will  order home O2.   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 -Spoke with Dr. Teryl Lucy (radiation oncology) and informed him of patient's decision to have his radiation oncology in [place]. Will set up   Acute GI bleed -GI has evaluated patient and feels bleeding most likely consistent with recent EGD with dilation and esophageal ulcer diagnosed 4 days ago. Do not believe further workup warranted. -6/9 Transfuse 2 units PRBC -Transfuse for hemoglobin<7  Low back pain with radiation to the left lower extremity  -Secondary to metastatic cancer unknown origin in the L-spine area with nerve root impingement and invasion into the pre-sacral  space see MRI results below -Continue Decadron 8mg  BID  Acute blood loss anemia  - baseline hemoglobin is not known. Closely follow CBC. -Currently hemoglobin stable.  Acute renal failure with urinary retention  -most likely secondary to nerve root impingement/tumor extension presacral area.  -Continue Foley; patient and wife counseled that he may get some relief from XRT but not guaranteed.       Code Status: FULL Family Communication: Wife present at time of exam Disposition Plan: Diagnosis of primary cancer    Consultants: Telephone consult Dr. Teryl Lucy (radiation oncology)  Dr.Salem Hinda Lenis (GI) Dr.David B Simonds (PCCM)   Procedure/Significant Events: 6/8 PCXR review of film shows patient with multiple bilateral large well-circumscribed nodules; metastatic carcinoma? 6/9 MRI L-spine without contrast; -L5 and sacral metastasis with tumor in  the ventral sacral epidural space; inserting for nerve root impingement LEFT S1 nerve. -Tumor extends into the LEFT presacral space, -Moderate canal stenosis at L2-3 and L4-5. Status post LEFT L4-5  hemilaminectomy. -Neural foraminal narrowing L2-3 through L5-S1: Moderate to severe on the RIGHT at L4-5. 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 -Left T2 vertebral body concerning for metastatic disease.  -Mild chronic compression fractures T3 -moderate to severe chronic compression fracture T8. -Bilateral lung nodules. Multiple liver lesions, C/W metastatic disease.  6/9Transfuse 2 units PRBC 6/9 CT abdomen pelvis without contrast/CT chest without contrast;Extensive pulmonary metastasis and liver metastasis. - Mass involving head of pancreas, - large mass involving central portion of the sacrum - Infrarenal abdominal aortic aneurysm.-Indeterminate soft tissue attenuating filling defect within proximal colon may represent neoplasm. 6/10 MRI brain wo contrast; Posterior right temporal lobe lesion confirmed; 2-3 cm area vasogenic edema is present with 10 mm mass-like area with blood products.   Culture 6/9 MRSA by PCR negative 6/9 C. difficile by PCR negative 6/9 blood left/right hand NGTD   Antibiotics: Zosyn 6/8>> stopped 6/12 Vancomycin 6/8>>.6/12  DVT prophylaxis: Lovenox   Devices    LINES / TUBES:      Continuous Infusions:    Objective: VITAL SIGNS: Temp: 98.9 F (37.2 C) (06/14 0558) Temp Source: Oral (06/14 0558) BP: 176/100 mmHg (06/14 0558) Pulse Rate: 96 (06/14 0558) SPO2; FIO2:   Intake/Output Summary (Last 24 hours) at 11/10/14 1253 Last data filed at 11/10/14 0603  Gross per 24 hour  Intake   1013 ml  Output   2025 ml  Net  -1012 ml     Exam: General: A/O 4, No acute respiratory distress, some DOE on exertion (SPO2?) Eyes: Negative headache, eye pain, double vision, retinal hemorrhage ENT: Negative Runny nose, negative ear pain, negative tinnitus, negative gingival bleeding,  Neck:  Negative scars, masses, torticollis, lymphadenopathy (supra  clavicular lymph node?), Negative JVD Lungs: diffuse expiratory wheezing, negative crackles Cardiovascular: Tachycardic, Regular rhythm without murmur gallop or rub normal S1 and S2 Abdomen:negative abdominal pain, negative dysphagia, Nontender, nondistended, soft, bowel sounds positive, no rebound, no ascites, no appreciable mass Extremities: No significant cyanosis, clubbing, or edema bilateral lower extremities Psychiatric:  Negative depression, negative anxiety, negative fatigue, negative mania  Neurologic:  Cranial nerves II through XII intact, tongue/uvula midline, all extremities muscle strength 5/5, sensation intact throughout,  negative dysarthria, negative expressive aphasia, negative receptive aphasia.      Data Reviewed: Basic Metabolic Panel:  Recent Labs Lab 11/05/14 0522 11/06/14 0232 11/07/14 0245 11/08/14 0357 11/10/14 0804  NA 129* 128* 135 133* 134*  K 3.9 3.9 3.9 4.4 3.9  CL 100* 100* 105 103 102  CO2 18* 18* 20* 20* 22  GLUCOSE 157* 394* 267* 207* 114*  BUN 30* 23* 21* 23* 21*  CREATININE 1.57* 1.37* 1.42* 1.38* 1.09  CALCIUM 7.5* 7.6* 8.0* 7.9* 8.1*  MG  --  1.8  --   --  1.8   Liver Function Tests:  Recent Labs Lab 11/05/14 0522 11/06/14 0232 11/07/14 0245 11/08/14 0357 11/10/14 0804  AST 151* 133* 121* 24 147*  ALT 80* 80* 80* 76* 87*  ALKPHOS 214* 265* 247* 230* 262*  BILITOT 2.0* 2.7* 1.3* 1.1 2.0*  PROT 4.8* 4.9* 4.9* 4.7* 4.8*  ALBUMIN 2.2* 2.0* 2.0* 2.0* 2.2*   No results for input(s): LIPASE, AMYLASE in the last 168 hours. No results for input(s): AMMONIA in the last 168  hours. CBC:  Recent Labs Lab 11/06/14 0232  11/08/14 0357 11/08/14 1830 11/09/14 0805 11/09/14 1840 11/10/14 0617  WBC 10.7*  < > 12.7* 15.5* 12.1* 14.8* 17.8*  NEUTROABS 9.7*  --   --   --   --   --   --   HGB 9.4*  < > 9.5* 10.6* 9.9* 10.6* 10.7*  HCT 27.7*  < > 27.9* 30.9* 28.8* 31.5* 31.2*  MCV 88.5  < > 88.9 89.8 90.9 90.3 89.4  PLT 216  < > 186 171  164 183 169  < > = values in this interval not displayed. Cardiac Enzymes:  Recent Labs Lab 11/05/14 0135 11/05/14 1011 11/05/14 1655  TROPONINI <0.03 <0.03 <0.03   BNP (last 3 results)  Recent Labs  11/04/14 1936 11/05/14 0135  BNP 63.3 56.4    ProBNP (last 3 results) No results for input(s): PROBNP in the last 8760 hours.  CBG:  Recent Labs Lab 11/09/14 1156 11/09/14 1700 11/09/14 2151 11/10/14 0744 11/10/14 1110  GLUCAP 319* 171* 109* 101* 138*    Recent Results (from the past 240 hour(s))  MRSA PCR Screening     Status: None   Collection Time: 11/05/14  1:05 AM  Result Value Ref Range Status   MRSA by PCR NEGATIVE NEGATIVE Final    Comment:        The GeneXpert MRSA Assay (FDA approved for NASAL specimens only), is one component of a comprehensive MRSA colonization surveillance program. It is not intended to diagnose MRSA infection nor to guide or monitor treatment for MRSA infections.   Culture, blood (routine x 2)     Status: None (Preliminary result)   Collection Time: 11/05/14  3:12 AM  Result Value Ref Range Status   Specimen Description BLOOD RIGHT HAND  Final   Special Requests BOTTLES DRAWN AEROBIC ONLY 4CC  Final   Culture   Final           BLOOD CULTURE RECEIVED NO GROWTH TO DATE CULTURE WILL BE HELD FOR 5 DAYS BEFORE ISSUING A FINAL NEGATIVE REPORT Performed at [facility]    Report Status PENDING  Incomplete  Culture, blood (routine x 2)     Status: None (Preliminary result)   Collection Time: 11/05/14  3:17 AM  Result Value Ref Range Status   Specimen Description BLOOD LEFT HAND  Final   Special Requests BOTTLES DRAWN AEROBIC AND ANAEROBIC 10CC EA  Final   Culture   Final           BLOOD CULTURE RECEIVED NO GROWTH TO DATE CULTURE WILL BE HELD FOR 5 DAYS BEFORE ISSUING A FINAL NEGATIVE REPORT Performed at [facility]    Report Status PENDING  Incomplete  Clostridium Difficile by PCR (not at [facility])     Status: None    Collection Time: 11/05/14 11:01 AM  Result Value Ref Range Status   C difficile by pcr NEGATIVE NEGATIVE Final     Studies:  Recent x-ray studies have been reviewed in detail by the Attending Physician  Scheduled Meds:  Scheduled Meds: . dexamethasone  8 mg Oral Q12H  . dextromethorphan-guaiFENesin  1 tablet Oral BID  . enoxaparin (LOVENOX) injection  1 mg/kg Subcutaneous Q12H  . insulin aspart  0-20 Units Subcutaneous TID WC  . insulin aspart  0-5 Units Subcutaneous QHS  . insulin glargine  18 Units Subcutaneous QHS  . ipratropium-albuterol  3 mL Nebulization TID  . pantoprazole  40 mg Oral BID  .  sucralfate  1 g Oral TID WC & HS    Time spent on care of this patient: 40 mins   Leen Tworek, Geraldo Docker , MD  [facility] Office  [phone] Pager - [phone]  On-Call/Text Page:      [email or web address]      password [email or web address]  If 7PM-7AM, please contact night-coverage [email or web address] Password [email or web address] 11/10/2014, 12:53 PM   LOS: 6 days   Care during the described time interval was provided by me .  I have reviewed this patient's available data, including medical history, events of note, physical examination, and all test results as part of my evaluation. I have personally reviewed and interpreted all radiology studies.   Dia Crawford, MD [phone] Pager

## 2014-11-11 DIAGNOSIS — I82409 Acute embolism and thrombosis of unspecified deep veins of unspecified lower extremity: Secondary | ICD-10-CM

## 2014-11-11 LAB — CULTURE, BLOOD (ROUTINE X 2)
CULTURE: NO GROWTH
Culture: NO GROWTH

## 2014-11-11 LAB — CBC WITH DIFFERENTIAL/PLATELET
Basophils Absolute: 0 10*3/uL (ref 0.0–0.1)
Basophils Relative: 0 % (ref 0–1)
Eosinophils Absolute: 0 10*3/uL (ref 0.0–0.7)
Eosinophils Relative: 0 % (ref 0–5)
HCT: 29.8 % — ABNORMAL LOW (ref 39.0–52.0)
Hemoglobin: 10.2 g/dL — ABNORMAL LOW (ref 13.0–17.0)
LYMPHS ABS: 1 10*3/uL (ref 0.7–4.0)
Lymphocytes Relative: 7 % — ABNORMAL LOW (ref 12–46)
MCH: 30.3 pg (ref 26.0–34.0)
MCHC: 34.2 g/dL (ref 30.0–36.0)
MCV: 88.4 fL (ref 78.0–100.0)
MONO ABS: 1 10*3/uL (ref 0.1–1.0)
MONOS PCT: 7 % (ref 3–12)
NEUTROS ABS: 12.2 10*3/uL — AB (ref 1.7–7.7)
Neutrophils Relative %: 86 % — ABNORMAL HIGH (ref 43–77)
PLATELETS: 134 10*3/uL — AB (ref 150–400)
RBC: 3.37 MIL/uL — ABNORMAL LOW (ref 4.22–5.81)
RDW: 14.9 % (ref 11.5–15.5)
WBC: 14.2 10*3/uL — AB (ref 4.0–10.5)

## 2014-11-11 LAB — MAGNESIUM: Magnesium: 1.7 mg/dL (ref 1.7–2.4)

## 2014-11-11 LAB — COMPREHENSIVE METABOLIC PANEL
ALBUMIN: 2 g/dL — AB (ref 3.5–5.0)
ALT: 122 U/L — AB (ref 17–63)
AST: 234 U/L — AB (ref 15–41)
Alkaline Phosphatase: 262 U/L — ABNORMAL HIGH (ref 38–126)
Anion gap: 10 (ref 5–15)
BUN: 23 mg/dL — ABNORMAL HIGH (ref 6–20)
CO2: 22 mmol/L (ref 22–32)
Calcium: 8 mg/dL — ABNORMAL LOW (ref 8.9–10.3)
Chloride: 99 mmol/L — ABNORMAL LOW (ref 101–111)
Creatinine, Ser: 1.11 mg/dL (ref 0.61–1.24)
GFR calc Af Amer: >60 mL/min (ref >60)
Glucose, Bld: 122 mg/dL — ABNORMAL HIGH (ref 65–99)
POTASSIUM: 4.2 mmol/L (ref 3.5–5.1)
SODIUM: 131 mmol/L — AB (ref 135–145)
Total Bilirubin: 1.9 mg/dL — ABNORMAL HIGH (ref 0.3–1.2)
Total Protein: 4.8 g/dL — ABNORMAL LOW (ref 6.5–8.1)

## 2014-11-11 LAB — GLUCOSE, CAPILLARY
GLUCOSE-CAPILLARY: 133 mg/dL — AB (ref 65–99)
Glucose-Capillary: 104 mg/dL — ABNORMAL HIGH (ref 65–99)
Glucose-Capillary: 250 mg/dL — ABNORMAL HIGH (ref 65–99)
Glucose-Capillary: 261 mg/dL — ABNORMAL HIGH (ref 65–99)

## 2014-11-11 NOTE — Progress Notes (Signed)
Noted CM consult to check coverage for Lovenox therapy.  Will investigate, and produce results when available.    Reinaldo Raddle, RN, BSN  Trauma/Neuro ICU Case Manager [phone]

## 2014-11-11 NOTE — Progress Notes (Signed)
[place] TEAM 1 - Stepdown/ICU TEAM Progress Note  ETHON WYMER XBD:[id number] DOB: [birth year] DOA: 11/04/2014 PCP: Maryella Shivers, MD  Admit HPI / Brief Narrative: 69 year old M Hx hypertension and hyperlipidemia who had been experiencing increasing low back pain over several weeks. He went to the emergency room because of urinary retention and had a Foley catheter placed. He followed up with a Urologist the day prior to his admit who took the catheter out but patient still could not urinate and the catheter had to be replaced. He later returned to the emergency room c/o shortness of breath and was found to be hypotensive and tachycardic with a hemoglobin around 8 with heme positive stool. A chest x-ray suggested metastatic disease. An MRI of the spine revealed a lesion in the lumbar region.   Of note the patient had an EGD with dilation of an esophageal stricture 4 days prior to this admit. He also had an esophageal ulcer biopsied which was negative. He has never had a colonoscopy.  HPI/Subjective: The patient has had intermittent epistaxis today.  He has had 3 episodes total with one of those episodes taking quite some time before it ceased.  He presently is having no blood loss.  He denies chest pain fevers chills nausea or vomiting.  He is not short of breath at rest but admits it takes only a few steps before he becomes very short of breath when attempting to ambulate.  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, pancreatic mass, sacral T2 and brain mets, and soft tissue defect in proximal colon  Oncology following - liver bx specimen has confirm cancer but unfortunately was not able to provide a definite source with the most likely being an upper GI/pancreatic source - palliative XRT being considered for bone/brain mets but patient has now decided to pursue this in [place] and [place] following w/ [place] and our to assist with scheduling  appointments for follow-up in [place]  Acute respiratory failure with hypoxia / COPD / CAP  multifactorial to include COPD from previous history of smoking + metastatic cancer unknown primary - has completed a 5 day course of empiric antibiotics therapy - unfortunately the patient now appears to be O2 dependent and will need home O2 - home O2 screen ordered  Left gastrocnemius vein DVT Swelling markedly improved - Lovenox therapy will be required as an outpatient given the patient's metastatic cancer such that oral agents would be inadequate - epistaxis is problematic but at this point not yet severe enough to require cessation of blood thinner - thus far tolerating without evidence of recurrent GI bleeding - continue to follow every 12 hours CBC   Posterior right temporal lobe lesion w/ 2-3 cm area vasogenic edema  underlying lesion size 10 mm - Decadron dosing continues  Acute GI bleed GI has evaluated patient and feels bleeding most likely consistent with recent EGD with dilation and esophageal ulcer diagnosed 4 days prior to admission - do not believe further workup warranted - hemoglobin is stable - cont PPI BID - added prophylactic Carafate with need to anticoagulate   Low back pain with radiation to the left lower extremity  Secondary to metastatic cancer unknown origin in the L-spine area with nerve root impingement and invasion into the sacrum - Decadron continues - continue Foley catheter for urinary retention  Acute blood loss anemia +/- chronic malignancy associated anemia  baseline hemoglobin is not known - transfused 2 units PRBC  earlier in hospital stay - hemoglobin stable at this time despite full dose Lovenox  Hyponatremia  -Resolved w/ volume expansion but now fluctuating - overall is relatively stable  Metabolic acidosis  -Due to acute renal failure - essentially resolved   Acute renal failure with urinary retention  w/ foley and hydration crt appears to have stabilized  - will not attempt to remove Foley catheter  Extravasation of MRI contrast right anterior forearm  Benign exam - resolved without incident  Steroid induced hyperglycemia  -CBGs erratic but improved overall - follow without change today  Code Status: FULL Family Communication: Spoke with patient and wife at bedside Disposition Plan: PT/OT evaluations to qualify patient for home assistance and to determine what home equipment will be required - oxygen qualification evaluation - case manager to investigate availability/preauthorization status of Lovenox - monitor for further epistaxis - possible discharge home 6/17  Consultants: PCCM [facility] Radiation Oncology  Procedure/Significant Events: 6/8 PCXR review of film shows patient with multiple bilateral large well-circumscribed nodules; metastatic carcinoma? 6/9 MRI L-spine without contrast; -L5 and sacral metastasis with tumor in the ventral sacral epidural space; inserting for nerve root impingement LEFT S1 nerve. -Tumor extends into the LEFT presacral space, -Moderate canal stenosis at L2-3 and L4-5. Status post LEFT L4-5 hemilaminectomy. -Neural foraminal narrowing L2-3 through L5-S1: Moderate to severe on the RIGHT at L4-5. 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 -Left T2 vertebral body concerning for metastatic disease.  -Mild chronic compression fractures T3 -moderate to severe chronic compression fracture T8. -Bilateral lung nodules. Multiple liver lesions, C/W metastatic disease.  6/9Transfused 2 units PRBC  Antibiotics: Zosyn 6/8 > 6/12 Vancomycin 6/8 > 6/12  DVT prophylaxis: Lovenox  Objective: Blood pressure 155/90, pulse 89, temperature 98.8 F (37.1 C), temperature source Oral, resp. rate 19, height 5\' 5"  (1.651 m), weight 74.8 kg (164 lb 14.5 oz), SpO2 96 %.  Intake/Output Summary (Last 24 hours) at 11/11/14  1627 Last data filed at 11/11/14 0900  Gross per 24 hour  Intake   1200 ml  Output   1775 ml  Net   -575 ml   Exam: General: No acute respiratory distress - alert and conversant  Lungs: Clear to auscultation bilaterally without wheeze or crackles Cardiovascular: Regular rate and rhythm without murmur or rub  Abdomen: Nontender, nondistended, soft, bowel sounds positive, no rebound, no ascites, no appreciable mass Extremities: No significant cyanosis or clubbing, trace edema B LE - TED hose in place bilateral lower extremities    Data Reviewed: Basic Metabolic Panel:  Recent Labs Lab 11/06/14 0232 11/07/14 0245 11/08/14 0357 11/10/14 0804 11/11/14 0449  NA 128* 135 133* 134* 131*  K 3.9 3.9 4.4 3.9 4.2  CL 100* 105 103 102 99*  CO2 18* 20* 20* 22 22  GLUCOSE 394* 267* 207* 114* 122*  BUN 23* 21* 23* 21* 23*  CREATININE 1.37* 1.42* 1.38* 1.09 1.11  CALCIUM 7.6* 8.0* 7.9* 8.1* 8.0*  MG 1.8  --   --  1.8 1.7   Liver Function Tests:  Recent Labs Lab 11/06/14 0232 11/07/14 0245 11/08/14 0357 11/10/14 0804 11/11/14 0449  AST 133* 121* 24 147* 234*  ALT 80* 80* 76* 87* 122*  ALKPHOS 265* 247* 230* 262* 262*  BILITOT 2.7* 1.3* 1.1 2.0* 1.9*  PROT 4.9* 4.9* 4.7* 4.8* 4.8*  ALBUMIN 2.0* 2.0* 2.0* 2.2* 2.0*   CBC:  Recent Labs Lab 11/06/14 0232  11/09/14  0805 11/09/14 1840 11/10/14 0617 11/10/14 1910 11/11/14 0449  WBC 10.7*  < > 12.1* 14.8* 17.8* 14.6* 14.2*  NEUTROABS 9.7*  --   --   --   --   --  12.2*  HGB 9.4*  < > 9.9* 10.6* 10.7* 10.3* 10.2*  HCT 27.7*  < > 28.8* 31.5* 31.2* 30.3* 29.8*  MCV 88.5  < > 90.9 90.3 89.4 89.1 88.4  PLT 216  < > 164 183 169 137* 134*  < > = values in this interval not displayed. Cardiac Enzymes:  Recent Labs Lab 11/05/14 0135 11/05/14 1011 11/05/14 1655  TROPONINI <0.03 <0.03 <0.03   CBG:  Recent Labs Lab 11/10/14 0744 11/10/14 1110 11/10/14 1717 11/10/14 2155 11/11/14 0818  GLUCAP 101* 138* 161* 180* 104*     Recent Results (from the past 240 hour(s))  MRSA PCR Screening     Status: None   Collection Time: 11/05/14  1:05 AM  Result Value Ref Range Status   MRSA by PCR NEGATIVE NEGATIVE Final    Comment:        The GeneXpert MRSA Assay (FDA approved for NASAL specimens only), is one component of a comprehensive MRSA colonization surveillance program. It is not intended to diagnose MRSA infection nor to guide or monitor treatment for MRSA infections.   Culture, blood (routine x 2)     Status: None   Collection Time: 11/05/14  3:12 AM  Result Value Ref Range Status   Specimen Description BLOOD RIGHT HAND  Final   Special Requests BOTTLES DRAWN AEROBIC ONLY 4CC  Final   Culture   Final    NO GROWTH 5 DAYS Performed at [facility]    Report Status 11/11/2014 FINAL  Final  Culture, blood (routine x 2)     Status: None   Collection Time: 11/05/14  3:17 AM  Result Value Ref Range Status   Specimen Description BLOOD LEFT HAND  Final   Special Requests BOTTLES DRAWN AEROBIC AND ANAEROBIC 10CC EA  Final   Culture   Final    NO GROWTH 5 DAYS Performed at [facility]    Report Status 11/11/2014 FINAL  Final  Clostridium Difficile by PCR (not at [facility])     Status: None   Collection Time: 11/05/14 11:01 AM  Result Value Ref Range Status   C difficile by pcr NEGATIVE NEGATIVE Final     Studies:  Recent x-ray studies have been reviewed in detail by the Attending Physician  Scheduled Meds:  Scheduled Meds: . dexamethasone  8 mg Oral Q12H  . dextromethorphan-guaiFENesin  1 tablet Oral BID  . enoxaparin (LOVENOX) injection  1 mg/kg Subcutaneous Q12H  . insulin aspart  0-20 Units Subcutaneous TID WC  . insulin aspart  0-5 Units Subcutaneous QHS  . insulin glargine  18 Units Subcutaneous QHS  . ipratropium-albuterol  3 mL Nebulization TID  . pantoprazole  40 mg Oral BID  . sucralfate  1 g Oral TID WC & HS    Time spent on care of this patient: 25  mins  Cherene Altes, MD [facility] For Consults/Admissions - Flow Manager - [phone] Office  [phone]  Contact MD directly via text page:      [email or web address]      password [facility]  11/11/2014, 4:27 PM   LOS: 7 days

## 2014-11-11 NOTE — Progress Notes (Signed)
Results of the biopsy noted and will discuss with the patient and family later today. He will need oncology follow up upon discharge. Likely in [place].

## 2014-11-11 NOTE — Progress Notes (Signed)
IP PROGRESS NOTE  Subjective:   Events last few days noted. Patient to noted to have lower extremity edema and diagnosed with deep vein thrombosis of the left lower extremity. He is currently on Lovenox to treat that. He has reported intermittent epistaxis. Otherwise he feels relatively fair. His pain is under reasonable control.  Objective:  Vital signs in last 24 hours: Temp:  [98.5 F (36.9 C)-99 F (37.2 C)] 98.8 F (37.1 C) (06/15 0607) Pulse Rate:  [89-103] 89 (06/15 0607) Resp:  [19-22] 19 (06/15 0607) BP: (147-155)/(90-95) 155/90 mmHg (06/15 0607) SpO2:  [60 %-100 %] 96 % (06/15 0836) Weight:  [162 lb 14.7 oz (73.9 kg)-164 lb 14.5 oz (74.8 kg)] 164 lb 14.5 oz (74.8 kg) (06/15 0607) Weight change: -2 lb 6.8 oz (-1.1 kg) Last BM Date: 11/11/14  Intake/Output from previous day: 06/14 0701 - 06/15 0700 In: 1440 [P.O.:1440] Out: 2628 [Urine:2625; Stool:3] Chronically ill-appearing gentleman sitting in a chair. Not in any distress. Mouth: mucous membranes moist, pharynx normal without lesions. Epistaxis noted. Resp: wheezes bilaterally Cardio: regular rate and rhythm, S1, S2 normal, no murmur, click, rub or gallop GI: soft, non-tender; bowel sounds normal; no masses,  no organomegaly Extremities: Left lower extremity edema noted. Neurological: No deficits noted.   Lab Results:  Recent Labs  11/10/14 1910 11/11/14 0449  WBC 14.6* 14.2*  HGB 10.3* 10.2*  HCT 30.3* 29.8*  PLT 137* 134*    BMET  Recent Labs  11/10/14 0804 11/11/14 0449  NA 134* 131*  K 3.9 4.2  CL 102 99*  CO2 22 22  GLUCOSE 114* 122*  BUN 21* 23*  CREATININE 1.09 1.11  CALCIUM 8.1* 8.0*    Studies/Results:     Liver, needle/core biopsy, left (11/06/2014). - METASTATIC POORLY DIFFERENTIATED CARCINOMA.          Medications: I have reviewed the patient's current medications.  Assessment/Plan:  69 year old with:   1. Metastatic malignancy of unknown primary. The differential  diagnosis include GI malignancy versus lung cancer. The pathology confirmed the presence of poorly differentiated carcinoma with the primary could be of a GI etiology. He understands that any therapy office systemically would be palliative in nature and he is certainly not in any shape to take it at this time. This certainly can change in the near future but I doubt it at this time.  He will need oncology follow up upon discharge and we'll arrange for that to be done at the [facility].  2. Sacral metastasis and brain metastasis: He would benefit from palliative radiation therapy. Dr. Tammi Klippel is evaluating the patient for possible radiation therapy.  3. Anemia: This is related to malignancy versus GI blood loss. Hemoglobin is relatively stable.  4. Prognosis: Overall I feel his prognosis is poor and I think palliative medicine might be of help in directing goals of care moving forward. Rate is a poor candidate for systemic therapy at this time.   5. Deep vein thrombosis: I agree with Lovenox therapy at this time.       LOS: 7 days   DHRCBU,LAGTX 11/11/2014, 2:49 PM

## 2014-11-12 DIAGNOSIS — M5442 Lumbago with sciatica, left side: Secondary | ICD-10-CM | POA: Insufficient documentation

## 2014-11-12 DIAGNOSIS — I82409 Acute embolism and thrombosis of unspecified deep veins of unspecified lower extremity: Secondary | ICD-10-CM | POA: Insufficient documentation

## 2014-11-12 DIAGNOSIS — I82402 Acute embolism and thrombosis of unspecified deep veins of left lower extremity: Secondary | ICD-10-CM

## 2014-11-12 LAB — CBC
HEMATOCRIT: 29.6 % — AB (ref 39.0–52.0)
HEMOGLOBIN: 10.1 g/dL — AB (ref 13.0–17.0)
MCH: 30.1 pg (ref 26.0–34.0)
MCHC: 34.1 g/dL (ref 30.0–36.0)
MCV: 88.1 fL (ref 78.0–100.0)
Platelets: 155 10*3/uL (ref 150–400)
RBC: 3.36 MIL/uL — ABNORMAL LOW (ref 4.22–5.81)
RDW: 14.7 % (ref 11.5–15.5)
WBC: 16.6 10*3/uL — ABNORMAL HIGH (ref 4.0–10.5)

## 2014-11-12 LAB — GLUCOSE, CAPILLARY
Glucose-Capillary: 196 mg/dL — ABNORMAL HIGH (ref 65–99)
Glucose-Capillary: 215 mg/dL — ABNORMAL HIGH (ref 65–99)
Glucose-Capillary: 314 mg/dL — ABNORMAL HIGH (ref 65–99)

## 2014-11-12 MED ORDER — INSULIN GLARGINE 100 UNIT/ML SOLOSTAR PEN: 18.0000 [IU] | PEN_INJECTOR | Freq: Every day | SUBCUTANEOUS | Status: AC

## 2014-11-12 MED ORDER — INSULIN PEN NEEDLE 31G X 8 MM MISC: 18.0000 [IU] | Freq: Every day | Status: AC

## 2014-11-12 MED ORDER — DEXAMETHASONE 4 MG PO TABS: 8.0000 mg | ORAL_TABLET | Freq: Two times a day (BID) | ORAL | Status: AC

## 2014-11-12 MED ORDER — ALBUTEROL SULFATE (2.5 MG/3ML) 0.083% IN NEBU: 3.0000 mL | INHALATION_SOLUTION | Freq: Four times a day (QID) | RESPIRATORY_TRACT | Status: DC | PRN

## 2014-11-12 MED ORDER — TIOTROPIUM BROMIDE MONOHYDRATE 18 MCG IN CAPS: 18.0000 ug | ORAL_CAPSULE | Freq: Every day | RESPIRATORY_TRACT | Status: AC

## 2014-11-12 MED ORDER — SUCRALFATE 1 GM/10ML PO SUSP: 1.0000 g | Freq: Three times a day (TID) | ORAL | Status: AC

## 2014-11-12 MED ORDER — IPRATROPIUM-ALBUTEROL 0.5-2.5 (3) MG/3ML IN SOLN: 3.0000 mL | Freq: Four times a day (QID) | RESPIRATORY_TRACT | Status: DC | PRN

## 2014-11-12 MED ORDER — ENOXAPARIN SODIUM 60 MG/0.6ML ~~LOC~~ SOLN: 60.0000 mg | SUBCUTANEOUS | Status: DC

## 2014-11-12 MED ORDER — ENOXAPARIN SODIUM 60 MG/0.6ML ~~LOC~~ SOLN: 60.0000 mg | Freq: Two times a day (BID) | SUBCUTANEOUS | Status: AC

## 2014-11-12 MED ORDER — TIOTROPIUM BROMIDE MONOHYDRATE 18 MCG IN CAPS
18.0000 ug | ORAL_CAPSULE | Freq: Every day | RESPIRATORY_TRACT | Status: DC
  Filled 2014-11-12: qty 5

## 2014-11-12 NOTE — Discharge Summary (Signed)
Physician Discharge Summary  REISS MOWREY QBH:[id number] DOB: Sep 14, 1945 DOA: 11/04/2014  PCP: Maryella Shivers, MD  Admit date: 11/04/2014 Discharge date: 11/12/2014  Time spent:91minutes Recommendations for Outpatient Follow-up:  Acute respiratory failure with hypoxia/COPD/CAP  -Most likely multifactorial to include COPD from previous history of smoking, metastatic cancer unknown primary, CAP -Although patient never officially diagnosed with COPD, was former heavy smoker (50+-pack-year) and findings consistent with COPD - Will continue patient as outpatient on Spiriva  -Albuterol PRN  -Completed a 5 day course of empiric antibiotics therapy SATURATION QUALIFICATIONS: (This note is used to comply with regulatory documentation for home oxygen) Patient Saturations on Room Air at Rest = 77% Patient Saturations on Room Air while Ambulating = 72% Patient Saturations on 2 Liters of oxygen while Ambulating =88 % Please briefly explain why patient needs home oxygen:pt is dyspneic when walking and at rest. -Requires home O2 2 L O2 via [place] when sedentary; 3 L O2 via [place] during exertion.   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 -Spoke with Dr. Teryl Lucy (radiation oncology) and informed him of patient's decision to have his radiation oncology in [place]. -Patient has appointment with Dr. Gatha Mayer (radiation oncology) on 6/20 at 10:15 in [place]  Acute GI bleed -GI has evaluated patient and feels bleeding most likely consistent with recent EGD with dilation and esophageal ulcer.  -6/9 Transfuse 2 units  PRBC -Hemoglobin has been stable -Transfuse for hemoglobin<7 -PCP and oncology to monitor  Low back pain with radiation to the left lower extremity  -Secondary to metastatic cancer unknown origin in the L-spine area with nerve root impingement and invasion into the pre-sacral space see MRI results below -Continue Decadron 8mg  BID; oncology to determine when/if to discontinue. -Patient to continue his Lantus 18 units QHS for hyperglycemia secondary to steroid-induced. -PCP to titrate insulin to manage iatrogenic hyperglycemia  Acute blood loss anemia  - baseline hemoglobin is not known. Closely follow CBC. -Currently hemoglobin stable.  Acute renal failure with urinary retention  -most likely secondary to nerve root impingement/tumor extension presacral area.  -Continue Foley upon discharge -patient and wife counseled that he may get some relief from XRT but not guaranteed. -Follow-up with oncology/PCP      Discharge Diagnoses:  Active Problems:   Upper GI bleeding   Acute respiratory failure with hypoxia   ARF (acute renal failure)   Normocytic anemia   Hypertension   Urinary retention   Metastatic cancer   COPD exacerbation   CAP (community acquired pneumonia)   Acute GI bleeding   Lumbago   Acute blood loss anemia   Acute renal failure syndrome   Acute urinary retention   Right-sided low back pain with right-sided sciatica   Discharge Condition: Stable  Diet recommendation: Regular   Filed Weights   11/11/14 0500 11/11/14 0607 11/12/14 0500  Weight: 73.9 kg (162 lb 14.7 oz) 74.8 kg (164 lb 14.5 oz) 73.1 kg (161 lb 2.5 oz)    History of present illness:  69 year old WM PMHx hypertension and hyperlipidemia   Has been experiencing increasing low back pain over the last several weeks. He was diagnosed with  sciatica a few weeks ago. He also went to the emergency room because of urinary retention and had a Foley catheter placed. He went back to see the urologist  yesterday who took the catheter out but patient still could not urinate and saw the catheter had to be replaced. The patient's wife stated that they said they thought the urinary retention was from the sciatica area he came to our emergency room last night because of shortness of breath and he was found to be somewhat hypotensive and tachycardic. He was found to have a hemoglobin of around 8. He was found to have heme positive stool. A chest x-ray showed metastatic disease. An MRI of the spine shows lesion in the lumbar region. The patient had an EGD with esophageal dilation of an esophageal stricture up to 10 [place] on Monday of this week which was 4 days ago. He also had an esophageal ulcer which was biopsied and the biopsy was negative. He was found to have heme positive stool in the ER here but no melena. He is never had a colonoscopy. During his hospitalization patient has been diagnosed with metastatic cancer unknown primary. Most likely primary upper GI/pancreatic source. In addition patient diagnosed with COPD requiring home O2.    Consultants: Telephone consult Dr. Teryl Lucy (radiation oncology)  Dr.Salem Hinda Lenis (GI) Dr.David B Simonds (PCCM)   Procedure/Significant Events: 6/8 PCXR review of film shows patient with multiple bilateral large well-circumscribed nodules; metastatic carcinoma? 6/9 MRI L-spine without contrast; -L5 and sacral metastasis with tumor in the ventral sacral epidural space; inserting for nerve root impingement LEFT S1 nerve. -Tumor extends into the LEFT presacral space, -Moderate canal stenosis at L2-3 and L4-5. Status post LEFT L4-5 hemilaminectomy. -Neural foraminal narrowing L2-3 through L5-S1: Moderate to severe on the RIGHT at L4-5. 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 -Left T2 vertebral body concerning for metastatic disease.  -Mild chronic compression fractures  T3 -moderate to severe chronic compression fracture T8. -Bilateral lung nodules. Multiple liver lesions, C/W metastatic disease.  6/9Transfuse 2 units PRBC 6/9 CT abdomen pelvis without contrast/CT chest without contrast;Extensive pulmonary metastasis and liver metastasis. - Mass involving head of pancreas, - large mass involving central portion of the sacrum - Infrarenal abdominal aortic aneurysm.-Indeterminate soft tissue attenuating filling defect within proximal colon may represent neoplasm. 6/10 MRI brain wo contrast; Posterior right temporal lobe lesion confirmed; 2-3 cm area vasogenic edema is present with 10 mm mass-like area with blood products.   Culture 6/9 MRSA by PCR negative 6/9 C. difficile by PCR negative 6/9 blood left/right hand NGTD   Antibiotics: Zosyn 6/8>> stopped 6/12 Vancomycin 6/8>>.6/12  DVT prophylaxis: Lovenox    Discharge Exam: Filed Vitals:   11/11/14 2045 11/11/14 2135 11/12/14 0500 11/12/14 0558  BP:  169/87  158/93  Pulse: 100 108  101  Temp:  98.6 F (37 C)  98.6 F (37 C)  TempSrc:  Oral  Oral  Resp: 20 20  20   Height:      Weight:   73.1 kg (161 lb 2.5 oz)   SpO2: 97% 94%  93%    General: A/O 4, No acute respiratory distress, some DOE on exertion (SPO2?) Eyes: Negative headache, eye pain, double vision, retinal hemorrhage ENT: Negative Runny nose, negative ear pain, negative tinnitus, negative gingival bleeding,  Neck: Negative scars, masses, torticollis, lymphadenopathy (supra clavicular lymph node?), Negative JVD Lungs: diffuse expiratory wheezing, negative crackles Cardiovascular: Tachycardic, Regular  rhythm without murmur gallop or rub normal S1 and S2 Abdomen:negative abdominal pain, negative dysphagia, Nontender, nondistended, soft, bowel sounds positive, no rebound, no ascites, no appreciable mass   Discharge Instructions     Medication List    ASK your doctor about these medications        bethanechol 50 MG  tablet  Commonly known as:  URECHOLINE  Take 50 mg by mouth 2 (two) times daily.     HYDROcodone-acetaminophen 5-325 MG per tablet  Commonly known as:  NORCO/VICODIN  Take 1 tablet by mouth every 6 (six) hours as needed for moderate pain.     levofloxacin 250 MG tablet  Commonly known as:  LEVAQUIN  Take 250 mg by mouth daily.     omeprazole 40 MG capsule  Commonly known as:  PRILOSEC  Take 40 mg by mouth 2 (two) times daily.     pravastatin 10 MG tablet  Commonly known as:  PRAVACHOL  Take 10 mg by mouth daily.     tamsulosin 0.4 MG Caps capsule  Commonly known as:  FLOMAX  Take 0.4 mg by mouth daily after supper.     valsartan-hydrochlorothiazide 320-25 MG per tablet  Commonly known as:  DIOVAN-HCT  Take 1 tablet by mouth daily.       No Known Allergies    The results of significant diagnostics from this hospitalization (including imaging, microbiology, ancillary and laboratory) are listed below for reference.    Significant Diagnostic Studies: Ct Abdomen Pelvis Wo Contrast  11/05/2014   CLINICAL DATA:  69 year old with GI bleed and pulmonary embolus.  EXAM: CT CHEST, ABDOMEN AND PELVIS WITHOUT CONTRAST  TECHNIQUE: Multidetector CT imaging of the chest, abdomen and pelvis was performed following the standard protocol without IV contrast.  COMPARISON:  None.  FINDINGS: CT CHEST FINDINGS  Mediastinum: The heart size is normal. No pericardial effusion. Small hiatal hernia noted. Aortic atherosclerosis is identified. There also calcifications within the LAD and left circumflex coronary artery. Left paratracheal lymph node measures 1.4 cm, image 23/series 2. Sub- carinal lymph node measures 1.1 cm, image 28/series 2.  Lungs/Pleura: Advanced changes of centrilobular and paraseptal emphysema. Numerous pulmonary nodules are identified bilaterally. Index nodule within the right upper lobe measures 3.4 cm, image 17/series 3. In the left upper lobe there is a nodule measuring 2.4 cm,  image 22/series 3. Within the right lower lobe there is a 1.9 cm nodule, image 42/series 3.  Musculoskeletal: No aggressive lytic or sclerotic bone lesions. T8 compression fracture is again noted. T2 lesion identified on recent MRI is not well seen.  CT ABDOMEN AND PELVIS FINDINGS  Hepatobiliary: Extensive liver metastases are identified involving both lobes. Large index lesion within the right hepatic lobe measures 6.2 cm, image 51/series 2. Index lesion within the lateral segment of left lobe measures 6.3 cm, image 51/series 2. Index lesion within the inferior right hepatic lobe measures 2.3 cm, image 69/series 2. The gallbladder appears normal. No biliary dilatation.  Pancreas: Masslike enlargement of the head of pancreas measures 3.8 cm, image 68/series 2.  Spleen: There is a low attenuation structure within the central spleen measuring 1.9 cm, image 53/series 2.  Adrenals/Urinary Tract: The adrenal glands are both normal. Bilateral renal cysts are identified. These are incompletely characterized without IV contrast. The urinary bladder is collapsed around a Foley catheter balloon.  Stomach/Bowel: Hiatal hernia noted. The stomach is otherwise unremarkable. The small bowel loops have a normal course and caliber. A 3.3 cm soft tissue attenuating filling  defects arise from the lateral wall of the ascending colon, image 87/ series 2. This is of uncertain significance.  Vascular/Lymphatic: Calcified atherosclerotic disease involves the abdominal aorta. Infrarenal abdominal aortic aneurysm measures 3.1 cm.  Reproductive: Prostate gland appears enlarged. Symmetric appearance of the seminal vesicles.  Other: Increased presacral soft tissue along the left is identified, image 95/series 2 and is worrisome for tumor.  Musculoskeletal: Large mass involving the sacrum is identified. This measures at least 6.3 cm, image 91/series 2. This is better seen on MRI from 11/04/14  IMPRESSION: 1. Extensive pulmonary metastasis and  liver metastasis. 2. Mass involving head of pancreas may represent a focus of metastatic disease or primary pancreatic carcinoma. 3. Bone metastasis are better seen on MRI from 11/04/14 an 11/05/2014. There is a large mass involving the central portion of the sacrum with associated increased left-sided presacral soft tissue. 4. Aortic atherosclerosis. 5. Infrarenal abdominal aortic aneurysm. Recommend followup by ultrasound in 3 years. This recommendation follows ACR consensus guidelines: White Paper of the ACR Incidental Findings Committee II on Vascular Findings. J Am Coll Radiol 2013; 21:194-174 6. Indeterminate soft tissue attenuating filling defect within the proximal colon may represent neoplasm.   Electronically Signed   By: Kerby Moors M.D.   On: 11/05/2014 15:05   Ct Chest Wo Contrast  11/05/2014   CLINICAL DATA:  69 year old with GI bleed and pulmonary embolus.  EXAM: CT CHEST, ABDOMEN AND PELVIS WITHOUT CONTRAST  TECHNIQUE: Multidetector CT imaging of the chest, abdomen and pelvis was performed following the standard protocol without IV contrast.  COMPARISON:  None.  FINDINGS: CT CHEST FINDINGS  Mediastinum: The heart size is normal. No pericardial effusion. Small hiatal hernia noted. Aortic atherosclerosis is identified. There also calcifications within the LAD and left circumflex coronary artery. Left paratracheal lymph node measures 1.4 cm, image 23/series 2. Sub- carinal lymph node measures 1.1 cm, image 28/series 2.  Lungs/Pleura: Advanced changes of centrilobular and paraseptal emphysema. Numerous pulmonary nodules are identified bilaterally. Index nodule within the right upper lobe measures 3.4 cm, image 17/series 3. In the left upper lobe there is a nodule measuring 2.4 cm, image 22/series 3. Within the right lower lobe there is a 1.9 cm nodule, image 42/series 3.  Musculoskeletal: No aggressive lytic or sclerotic bone lesions. T8 compression fracture is again noted. T2 lesion identified on  recent MRI is not well seen.  CT ABDOMEN AND PELVIS FINDINGS  Hepatobiliary: Extensive liver metastases are identified involving both lobes. Large index lesion within the right hepatic lobe measures 6.2 cm, image 51/series 2. Index lesion within the lateral segment of left lobe measures 6.3 cm, image 51/series 2. Index lesion within the inferior right hepatic lobe measures 2.3 cm, image 69/series 2. The gallbladder appears normal. No biliary dilatation.  Pancreas: Masslike enlargement of the head of pancreas measures 3.8 cm, image 68/series 2.  Spleen: There is a low attenuation structure within the central spleen measuring 1.9 cm, image 53/series 2.  Adrenals/Urinary Tract: The adrenal glands are both normal. Bilateral renal cysts are identified. These are incompletely characterized without IV contrast. The urinary bladder is collapsed around a Foley catheter balloon.  Stomach/Bowel: Hiatal hernia noted. The stomach is otherwise unremarkable. The small bowel loops have a normal course and caliber. A 3.3 cm soft tissue attenuating filling defects arise from the lateral wall of the ascending colon, image 87/ series 2. This is of uncertain significance.  Vascular/Lymphatic: Calcified atherosclerotic disease involves the abdominal aorta. Infrarenal abdominal aortic aneurysm measures 3.1  cm.  Reproductive: Prostate gland appears enlarged. Symmetric appearance of the seminal vesicles.  Other: Increased presacral soft tissue along the left is identified, image 95/series 2 and is worrisome for tumor.  Musculoskeletal: Large mass involving the sacrum is identified. This measures at least 6.3 cm, image 91/series 2. This is better seen on MRI from 11/04/14  IMPRESSION: 1. Extensive pulmonary metastasis and liver metastasis. 2. Mass involving head of pancreas may represent a focus of metastatic disease or primary pancreatic carcinoma. 3. Bone metastasis are better seen on MRI from 11/04/14 an 11/05/2014. There is a large mass  involving the central portion of the sacrum with associated increased left-sided presacral soft tissue. 4. Aortic atherosclerosis. 5. Infrarenal abdominal aortic aneurysm. Recommend followup by ultrasound in 3 years. This recommendation follows ACR consensus guidelines: White Paper of the ACR Incidental Findings Committee II on Vascular Findings. J Am Coll Radiol 2013; 76:160-737 6. Indeterminate soft tissue attenuating filling defect within the proximal colon may represent neoplasm.   Electronically Signed   By: Kerby Moors M.D.   On: 11/05/2014 15:05   Mr Jeri Cos TG Contrast  11/06/2014   CLINICAL DATA:  69 year old male with recent diagnosis of extensive pulmonary, liver, and bone metastatic disease. Possible brain metastasis on recent cervical spine MRI.  EXAM: MRI HEAD WITHOUT AND WITH CONTRAST  TECHNIQUE: Multiplanar, multiecho pulse sequences of the brain and surrounding structures were obtained without and with intravenous contrast.  CONTRAST:  46mL MULTIHANCE GADOBENATE DIMEGLUMINE 529 MG/ML IV SOLN - INFILTRATED  COMPARISON:  Cervical spine MRI 11/05/2014.  FINDINGS: The patient's intravenous access infiltrated during contrast administration such that no post-contrast imaging of the brain could be obtained. The patient was asymptomatic.  These images demonstrate abnormal T2 and FLAIR hyperintensity in the posterior right temporal lobe encompassing an area of 3 cm (series 6, image 10) with underlying 9-10 mm heterogeneously decreased T2 signal rounded masslike area (series 5, images 9 and 10). Minimal regional mass effect. There is associated hemorrhage (Series 7, image 8) with mild intrinsic T1 and decreased T2 * signal.  No other vasogenic edema or blood products identified in the brain. Scattered small and patchy nonspecific bilateral cerebral white matter T2 and FLAIR hyperintensity. No midline shift. No ventriculomegaly. No restricted diffusion or evidence of acute infarction. Major intracranial  vascular flow voids are within normal limits. Negative noncontrast pituitary, cervicomedullary junction and visualized cervical spine. Visible bone marrow signal is within normal limits. Visible internal auditory structures appear normal. Mild right mastoid effusion. Negative nasopharynx. Negative paranasal sinuses. Orbits soft tissues appear normal. Visualized scalp soft tissues are within normal limits.  IMPRESSION: 1. The patient's IV access infiltrated during attempted contrast administration. No postcontrast imaging could be obtained. 2. Posterior right temporal lobe lesion is confirmed as suspected on the recent cervical spine MRI. A 2-3 cm area vasogenic edema is present with an underlying small 10 mm mass-like area with blood products. 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 An acute to subacute parenchymal brain hemorrhage (i.e. non neoplastic) is felt much less likely in this setting. 3. No other areas suspicious for metastatic disease on this noncontrast exam.   Electronically Signed   By: Genevie Ann M.D.   On: 11/06/2014 09:30   Mr Cervical Spine Wo Contrast  11/05/2014   CLINICAL DATA:  Metastatic disease. Urinary retention and back pain. Lumbar vertebral lesions consistent with metastatic disease.  EXAM: MRI CERVICAL AND THORACIC SPINE WITHOUT CONTRAST  TECHNIQUE: Multiplanar and multiecho pulse sequences  of the cervical spine, to include the craniocervical junction and cervicothoracic junction, and thoracic spine, were obtained without intravenous contrast.  COMPARISON:  MRI lumbar spine 11/04/2014  FINDINGS: MRI CERVICAL SPINE FINDINGS  Patient not able to hold still and the images are degraded by extensive motion. This study is marginally diagnostic with limited information.  Negative for cervical spine fracture. Hemangioma C7 vertebral body. No definite cervical metastatic lesions are identified.  Disc degeneration and spurring is present at C3-4, C4-5, C5-6, and C6-7 causing  spinal stenosis. Axial images contain little diagnostic information due to motion.  Abnormal signal T2 vertebral body on the left compatible with metastatic disease. See below report.  Edema is noted in the right occipital lobe worrisome for metastatic disease. MRI of the brain is recommended for further evaluation.  MRI THORACIC SPINE FINDINGS  Image quality limited by motion. Thoracic spine images are better quality than the cervical spine images.  Abnormal signal in the T2 vertebral body in left compatible with metastatic disease. No epidural tumor or cord compression. No other tumor deposits in the thoracic spine. No cord compression  Mild compression fracture T3 appears chronic and benign. Moderate chronic compression fracture of T8.  T9 lesion is hyperintense on T1 and T2 compatible with hemangioma. Small hemangiomata T4 vertebral body on the right and T7 vertebral body on the right.  Spinal cord signal normal.  Bilateral lung nodules are present. Multiple liver lesions are present. Bibasilar atelectasis in the lung bases.  IMPRESSION: Image quality is degraded by significant motion degrading image quality, especially in the cervical spine.  Cervical spondylosis and spinal stenosis. No definite cervical spine metastatic disease or fracture  Edema in the right occipital lobe worrisome for metastatic disease. MRI of the brain with contrast recommended for further evaluation.  Abnormal signal left T2 vertebral body concerning for metastatic disease. No epidural tumor or cord compression.  Mild chronic compression fractures T3 and moderate to severe chronic compression fracture T8.  Bilateral lung nodules. Multiple liver lesions. These findings are compatible with metastatic disease. Bibasilar atelectasis noted.   Electronically Signed   By: Franchot Gallo M.D.   On: 11/05/2014 09:51   Mr Thoracic Spine Wo Contrast  11/05/2014   CLINICAL DATA:  Metastatic disease. Urinary retention and back pain. Lumbar  vertebral lesions consistent with metastatic disease.  EXAM: MRI CERVICAL AND THORACIC SPINE WITHOUT CONTRAST  TECHNIQUE: Multiplanar and multiecho pulse sequences of the cervical spine, to include the craniocervical junction and cervicothoracic junction, and thoracic spine, were obtained without intravenous contrast.  COMPARISON:  MRI lumbar spine 11/04/2014  FINDINGS: MRI CERVICAL SPINE FINDINGS  Patient not able to hold still and the images are degraded by extensive motion. This study is marginally diagnostic with limited information.  Negative for cervical spine fracture. Hemangioma C7 vertebral body. No definite cervical metastatic lesions are identified.  Disc degeneration and spurring is present at C3-4, C4-5, C5-6, and C6-7 causing spinal stenosis. Axial images contain little diagnostic information due to motion.  Abnormal signal T2 vertebral body on the left compatible with metastatic disease. See below report.  Edema is noted in the right occipital lobe worrisome for metastatic disease. MRI of the brain is recommended for further evaluation.  MRI THORACIC SPINE FINDINGS  Image quality limited by motion. Thoracic spine images are better quality than the cervical spine images.  Abnormal signal in the T2 vertebral body in left compatible with metastatic disease. No epidural tumor or cord compression. No other tumor deposits in the thoracic  spine. No cord compression  Mild compression fracture T3 appears chronic and benign. Moderate chronic compression fracture of T8.  T9 lesion is hyperintense on T1 and T2 compatible with hemangioma. Small hemangiomata T4 vertebral body on the right and T7 vertebral body on the right.  Spinal cord signal normal.  Bilateral lung nodules are present. Multiple liver lesions are present. Bibasilar atelectasis in the lung bases.  IMPRESSION: Image quality is degraded by significant motion degrading image quality, especially in the cervical spine.  Cervical spondylosis and spinal  stenosis. No definite cervical spine metastatic disease or fracture  Edema in the right occipital lobe worrisome for metastatic disease. MRI of the brain with contrast recommended for further evaluation.  Abnormal signal left T2 vertebral body concerning for metastatic disease. No epidural tumor or cord compression.  Mild chronic compression fractures T3 and moderate to severe chronic compression fracture T8.  Bilateral lung nodules. Multiple liver lesions. These findings are compatible with metastatic disease. Bibasilar atelectasis noted.   Electronically Signed   By: Franchot Gallo M.D.   On: 11/05/2014 09:51   Mr Lumbar Spine Wo Contrast  11/05/2014   CLINICAL DATA:  LEFT hip pain radiating to LEFT leg for 3 weeks, no injury. History of urinary retention, lower extremity swelling. Assess sciatica.  EXAM: MRI LUMBAR SPINE WITHOUT CONTRAST  TECHNIQUE: Multiplanar, multisequence MR imaging of the lumbar spine was performed. No intravenous contrast was administered.  COMPARISON:  None.  FINDINGS: Using the reference level of the last well-formed intervertebral disc as L5-S1, low T1, bright STIR signal suspicious lesions spans the L5 vertebral body. Abnormal expansile signal throughout the sacrum, with at least 18 mm presacral soft tissue component/tumor. Low signal probable tumor extends into the LEFT neural foramen of the sacrum, partially imaged on the sacrum. No pathologic fracture. The lumbar vertebral bodies are intact and aligned, maintenance of lumbar lordosis. Status post LEFT L4-5 hemilaminectomy. Severe L4-5 disc height loss, moderate at L2-3 with decreased T2 signal within all lumbar disc consistent with moderate desiccation. Moderate chronic discogenic endplate changes W0-9, W1-1. Acute L3 superior endplate Schmorl's node. Scattered chronic Schmorl's nodes.  Conus medullaris terminates at L1 appears normal in morphology and signal characteristics. Cauda equina is normal in appearance. T2 bright probable  cysts in the kidneys bilaterally, incompletely imaged. 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  Level by level evaluation:  T12-L1: No disc bulge, canal stenosis nor neural foraminal narrowing.  L1-2: Annular bulging asymmetric to LEFT. Mild facet arthropathy and ligamentum flavum redundancy without canal stenosis or neural foraminal narrowing.  L2-3: Broad-based disc bulge and central disc protrusion in total measures 6 mm. Mild facet arthropathy and ligamentum flavum redundancy. Moderate canal stenosis. Mild LEFT neural foraminal narrowing.  L3-4: Annular bulging. Mild facet arthropathy and ligamentum flavum redundancy without canal stenosis. Mild to moderate RIGHT, mild LEFT neural foraminal narrowing.  L4-5: Status post LEFT hemilaminectomy. Small broad-based disc bulge, moderate facet arthropathy and RIGHT ligamentum flavum redundancy result in moderate canal stenosis. Moderate to severe RIGHT, mild to moderate LEFT neural foraminal narrowing.  L5-S1: Low signal tumor within the ventral epidural space from lower L5 into the sacrum resulting in mild canal stenosis, effacing the LEFT lateral recess which likely affects the traversing LEFT S1 nerve. Mild RIGHT neural foraminal narrowing.  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 No pathologic fracture. Tumor extends into the LEFT presacral space, incompletely characterized.  Moderate canal stenosis  at L2-3 and L4-5. Status post LEFT L4-5 hemilaminectomy.  Neural foraminal narrowing L2-3 through L5-S1: Moderate to severe on the RIGHT at L4-5.  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 Recommend followup by ultrasound in 3 years. This recommendation follows ACR consensus guidelines: White Paper of the ACR Incidental Findings Committee II on Vascular Findings. J Am Coll Radiol 2013; 10:789-794   Electronically Signed   By: Elon Alas M.D.   On: 11/05/2014 00:16   US  Biopsy  11/07/2014   CLINICAL DATA:  69 year old male with extensive multifocal metastatic disease of unknown primary. There is a mass in the region of the pancreatic head which could be the primary lesion or an additional area of metastatic adenopathy. Liver lesions are most accessible for safe percutaneous biopsy.  EXAM: ULTRASOUND BIOPSY CORE LIVER  Date: 11/07/2014  PROCEDURE: 1. Ultrasound-guided core biopsy of mass in the left hepatic lobe. Interventional Radiologist:  Criselda Peaches, MD  ANESTHESIA/SEDATION: Moderate (conscious) sedation was used. 1 mg Versed, 50 mcg Fentanyl were administered intravenously. The patient's vital signs were monitored continuously by radiology nursing throughout the procedure.  Sedation Time: 13 minutes  MEDICATIONS: None additional  TECHNIQUE: Informed consent was obtained from the patient following explanation of the procedure, risks, benefits and alternatives. The patient understands, agrees and consents for the procedure. All questions were addressed. A time out was performed.  The right upper quadrant was interrogated with ultrasound. Numerous hepatic lesions are identified. And approximately 6.7 cm mass is identified in the posterior superior aspect of the left hepatic lobe. This presents a suitable lesion for biopsy. An accessible skin entry site was selected and marked.  The region was then sterilely prepped and draped in standard fashion using chlorhexidine skin prep. Local anesthesia was attained by infiltration with 1% lidocaine. A small dermatotomy was made. Under real-time sonographic guidance, a 17 gauge introducer needle was advanced into the margin of the mass. Multiple 18 gauge core biopsies were then coaxially obtained with the bio Pince automated biopsy device.  As the introducer needle was removed the biopsy tract was embolized with a Gel-Foam slurry. The patient tolerated the procedure well.  Biopsy specimens were placed in formalin and delivered to  pathology for further analysis.  COMPLICATIONS: None  IMPRESSION: Technically successful ultrasound-guided core biopsy of left hepatic mass.  Signed,  Criselda Peaches, MD  [facility]  [facility]   Electronically Signed   By: Jacqulynn Cadet M.D.   On: 11/07/2014 08:11   Dg Chest Port 1 View  11/04/2014   CLINICAL DATA:  69 year old male with shortness of breath on exertion. No chest pain.  EXAM: PORTABLE CHEST - 1 VIEW  COMPARISON:  Chest x-ray 01/16/2001.  FINDINGS: There are numerous pulmonary nodules and masses throughout the lungs bilaterally, highly concerning for widespread metastatic disease. Largest of these measure up to 4 cm in the right upper lobe. No definite pleural effusions. No evidence of pulmonary edema. Heart size is normal. Upper mediastinal contours are within normal limits. Atherosclerosis in the thoracic aorta.  IMPRESSION: 1. Findings, as above, highly concerning for widespread metastatic disease to the lungs. Further evaluation with contrast enhanced chest CT is recommended at this time. 2. Atherosclerosis.   Electronically Signed   By: Vinnie Langton M.D.   On: 11/04/2014 20:33    Microbiology: Recent Results (from the past 240 hour(s))  MRSA PCR Screening     Status: None   Collection Time: 11/05/14  1:05 AM  Result Value  Ref Range Status   MRSA by PCR NEGATIVE NEGATIVE Final    Comment:        The GeneXpert MRSA Assay (FDA approved for NASAL specimens only), is one component of a comprehensive MRSA colonization surveillance program. It is not intended to diagnose MRSA infection nor to guide or monitor treatment for MRSA infections.   Culture, blood (routine x 2)     Status: None   Collection Time: 11/05/14  3:12 AM  Result Value Ref Range Status   Specimen Description BLOOD RIGHT HAND  Final   Special Requests BOTTLES DRAWN AEROBIC ONLY 4CC  Final   Culture   Final    NO GROWTH 5 DAYS Performed at [facility]    Report Status 11/11/2014 FINAL  Final  Culture, blood (routine x 2)     Status: None   Collection Time: 11/05/14  3:17 AM  Result Value Ref Range Status   Specimen Description BLOOD LEFT HAND  Final   Special Requests BOTTLES DRAWN AEROBIC AND ANAEROBIC 10CC EA  Final   Culture   Final    NO GROWTH 5 DAYS Performed at [facility]    Report Status 11/11/2014 FINAL  Final  Clostridium Difficile by PCR (not at [facility])     Status: None   Collection Time: 11/05/14 11:01 AM  Result Value Ref Range Status   C difficile by pcr NEGATIVE NEGATIVE Final     Labs: Basic Metabolic Panel:  Recent Labs Lab 11/06/14 0232 11/07/14 0245 11/08/14 0357 11/10/14 0804 11/11/14 0449  NA 128* 135 133* 134* 131*  K 3.9 3.9 4.4 3.9 4.2  CL 100* 105 103 102 99*  CO2 18* 20* 20* 22 22  GLUCOSE 394* 267* 207* 114* 122*  BUN 23* 21* 23* 21* 23*  CREATININE 1.37* 1.42* 1.38* 1.09 1.11  CALCIUM 7.6* 8.0* 7.9* 8.1* 8.0*  MG 1.8  --   --  1.8 1.7   Liver Function Tests:  Recent Labs Lab 11/06/14 0232 11/07/14 0245 11/08/14 0357 11/10/14 0804 11/11/14 0449  AST 133* 121* 24 147* 234*  ALT 80* 80* 76* 87* 122*  ALKPHOS 265* 247* 230* 262* 262*  BILITOT 2.7* 1.3* 1.1 2.0* 1.9*  PROT 4.9* 4.9* 4.7* 4.8* 4.8*  ALBUMIN 2.0* 2.0* 2.0* 2.2* 2.0*   No results for input(s): LIPASE, AMYLASE in the last 168 hours. No results for input(s): AMMONIA in the last 168 hours. CBC:  Recent Labs Lab 11/06/14 0232  11/09/14 1840 11/10/14 0617 11/10/14 1910 11/11/14 0449 11/12/14 0553  WBC 10.7*  < > 14.8* 17.8* 14.6* 14.2* 16.6*  NEUTROABS 9.7*  --   --   --   --  12.2*  --   HGB 9.4*  < > 10.6* 10.7* 10.3* 10.2* 10.1*  HCT 27.7*  < > 31.5* 31.2* 30.3* 29.8* 29.6*  MCV 88.5  < > 90.3 89.4 89.1 88.4 88.1  PLT 216  < > 183 169 137* 134* 155  < > = values in this interval not displayed. Cardiac Enzymes:  Recent Labs Lab 11/05/14 1655  TROPONINI <0.03   BNP: BNP (last 3  results)  Recent Labs  11/04/14 1936 11/05/14 0135  BNP 63.3 56.4    ProBNP (last 3 results) No results for input(s): PROBNP in the last 8760 hours.  CBG:  Recent Labs Lab 11/11/14 0818 11/11/14 1215 11/11/14 1710 11/11/14 2123 11/12/14 0812  GLUCAP 104* 250* 133* 261* 196*       Signed:  Vicente Serene  Sherral Hammers, MD [facility] [phone] pager

## 2014-11-12 NOTE — Care Management Note (Signed)
Case Management Note  Patient Details  Name: Stuart Vaughan MRN: [id number] Date of Birth: 1946/05/09  Subjective/Objective:    Pt for dc home today with spouse and children.  Met with pt, wife, and daughter to discuss dc arrangements.  Family agreeable to [facility] follow up at dc; spouse chooses [facility] for [facility] needs.  Referral to [facility] for [facility] follow up.  Start of care for [facility] 24-48h post dc date.  Pt will dc home on oxygen, as sats in the 70s on room air.  They also request hospital bed, WC, and BSC.                  Action/Plan: Referral to [facility] for DME needs.  Portable oxygen tank delivered to pt's room prior to dc.  Additional DME to be delivered to pt's home address this evening between 7 and 10pm, per [facility] rep.  Portable tank will last approx 5hrs, and oxygen is high priority for DME company.  Assured family that equipment will be delivered as promised.    Expected Discharge Date:     11/12/14             Expected Discharge Plan:  Home with [facility] care In-House Referral:     Discharge planning Services  CM Consult  Post Acute Care Choice:  Home Health Choice offered to:  Spouse  DME Arranged:  3-N-1, Hospital bed, Wheelchair manual, Oxygen DME Agency:  [place] Arranged:  RN, PT, OT, Nurse's Aide, Disease Management [place] Agency:  [place]  Status of Service:  Completed, signed off  Medicare Important Message Given:  Yes Date Medicare IM Given:  11/09/14 Medicare IM give by:  Ellan Lambert, RN, BSN  Date Additional Medicare IM Given:    Additional Medicare Important Message give by:     If discussed at [place] of Stay Meetings, dates discussed:    Additional Comments:  Reinaldo Raddle, RN, BSN  Trauma/Neuro ICU Case Manager [phone]

## 2014-11-12 NOTE — Progress Notes (Signed)
Occupational Therapy Evaluation Patient Details Name: Stuart Vaughan MRN: [id number] DOB: 1945/08/05 Today's Date: 11/12/2014    History of Present Illness 69 year old M Hx hypertension and hyperlipidemia who had been experiencing increasing low back pain over several weeks. 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 Patient independent PTA. Patient currently functioning at an overall min assist level. Patient will benefit from acute OT to increase overall independence in the areas of ADLs, functional mobility, and overall safety (including AE/DME education/safety) in order to safely discharge home with 24/7 assistance. Patient will need 24/7 assistance, if family unable to provide recommending SNF. Patient will benefit from a w/c due to decreased oxygen support during activities and mobility. Also, potential for palliative care consult?     Follow Up Recommendations  Home health OT;Supervision/Assistance - 24 hour    Equipment Recommendations  3 in 1 bedside comode    Recommendations for Other Services  None at this time   Precautions / Restrictions Precautions Precautions: Fall (oxygen) Restrictions Weight Bearing Restrictions: No      Mobility Bed Mobility General bed mobility comments: received in chair  Transfers Overall transfer level: Needs assistance Equipment used: Rolling walker (2 wheeled) Transfers: Sit to/from Stand Sit to Stand: Min assist General transfer comment: Min assist for various surfaces inclduing chair and toilet. Assist for safety and stability. Cues for hand placement and use of RW     Balance Overall balance assessment: Needs assistance Sitting-balance support: Feet supported Sitting balance-Leahy Scale: Fair     Standing balance support:  Bilateral upper extremity supported;During functional activity Standing balance-Leahy Scale: Poor Standing balance comment: required assist for stability during hygiene and functional tasks    ADL Overall ADL's : Needs assistance/impaired Eating/Feeding: Set up;Sitting   Grooming: Set up;Sitting   Upper Body Bathing: Set up;Sitting   Lower Body Bathing: Minimal assistance;Sit to/from stand;Cueing for safety   Upper Body Dressing : Set up;Sitting   Lower Body Dressing: Minimal assistance;Cueing for safety;Sit to/from stand   Toilet Transfer: Minimal assistance;RW;BSC;Ambulation;Grab bars General ADL Comments: Patient presents with deconditioning. patient on 2 liters of 02 and after activity sats decreased > 83%. Encouraged pursed lip breathing and rest break, sats increased >91% with extra time, rest break, and putting patient on 4 liters of 02 via [place]. Patient with decreased sensation > buttock and with incontinent BM. Patient transferred onto toilet and with successful BMl, but stated he was unable to tell if he was going or not.      Vision Additional Comments: no change from baseline          Pertinent Vitals/Pain Pain Assessment: No/denies pain     Hand Dominance Right   Extremity/Trunk Assessment Upper Extremity Assessment Upper Extremity Assessment: Generalized weakness   Lower Extremity Assessment Lower Extremity Assessment: Defer to PT evaluation LLE Deficits / Details: decreased sensation distal LLE as well as buttocks and groin. 3+/5 weakness noted.  LLE Sensation: decreased light touch;history of peripheral neuropathy;decreased proprioception LLE Coordination: decreased fine motor;decreased gross motor   Cervical / Trunk Assessment Cervical / Trunk Assessment: Normal (decreased sensation groin and buttock)   Communication Communication Communication: No difficulties   Cognition Arousal/Alertness: Awake/alert Behavior During Therapy: WFL for tasks  assessed/performed Overall Cognitive Status: No family/caregiver present to determine baseline cognitive functioning  Home Living Family/patient expects to be discharged to:: Private residence Living Arrangements: Spouse/significant other Available Help at Discharge: Family;Available 24 hours/day Type of Home: House Home Access: Stairs to enter [employer] of Steps: 2   Home Layout: One level     Bathroom Shower/Tub: Corporate investment banker: Standard Bathroom Accessibility: Yes How Accessible: Accessible via walker Home Equipment: [place] - 2 wheels;Cane - single point;Shower seat   Prior Functioning/Environment Level of Independence: Independent     OT Diagnosis: Generalized weakness   OT Problem List: Decreased strength;Decreased activity tolerance;Impaired balance (sitting and/or standing);Decreased coordination;Decreased safety awareness;Decreased knowledge of use of DME or AE;Decreased knowledge of precautions   OT Treatment/Interventions: Self-care/ADL training;Therapeutic exercise;Energy conservation;DME and/or AE instruction;Therapeutic activities;Patient/family education;Balance training    OT Goals(Current goals can be found in the care plan section) Acute Rehab OT Goals Patient Stated Goal: to go home OT Goal Formulation: With patient Time For Goal Achievement: 11/19/14 Potential to Achieve Goals: Good ADL Goals Pt Will Perform Grooming: with supervision;standing Pt Will Perform Upper Body Bathing: Independently;sitting Pt Will Perform Lower Body Bathing: with supervision;sit to/from stand Pt Will Perform Upper Body Dressing: Independently;sitting Pt Will Perform Lower Body Dressing: with supervision;sit to/from stand Pt Will Transfer to Toilet: with supervision;bedside commode;ambulating Pt Will Perform Toileting - Clothing Manipulation and hygiene: with supervision;sit to/from stand Pt Will Perform Tub/Shower  Transfer: Tub transfer;ambulating;rolling walker;shower seat;with supervision  OT Frequency: Min 2X/week   Barriers to D/C: None known at this time       Co-evaluation PT/OT/SLP Co-Evaluation/Treatment: Yes Reason for Co-Treatment: Complexity of the patient's impairments (multi-system involvement) PT goals addressed during session: Mobility/safety with mobility;Other (comment) (endurance) OT goals addressed during session: ADL's and self-care;Strengthening/ROM      End of Session Equipment Utilized During Treatment: Rolling walker Nurse Communication: Mobility status;Other (comment) (decreased 02 sats with activity/mobility)  Activity Tolerance: Patient tolerated treatment well Patient left: in chair;with call bell/phone within reach   Time: 9458-5929 OT Time Calculation (min): 19 min Charges:  OT General Charges $OT Visit: 1 Procedure OT Evaluation $Initial OT Evaluation Tier I: 1 Procedure  Koray Soter , MS, OTR/L, CLT Pager: [phone]   11/12/2014, 1:12 PM

## 2014-11-12 NOTE — Care Management Note (Signed)
Case Management Note  Patient Details  Name: Stuart Vaughan MRN: [id number] Date of Birth: 21-Jan-1946  Subjective/Objective:                    Action/Plan:   Expected Discharge Date:                  Expected Discharge Plan:  Acute to Acute Transfer  In-House Referral:     Discharge planning Services  CM Consult  Post Acute Care Choice:  Home Health Choice offered to:  Spouse  DME Arranged:  3-N-1, Hospital bed, Wheelchair manual, Oxygen DME Agency:  [place] Arranged:  RN, PT, OT, Nurse's Aide, Disease Management [place] Agency:  [place]  Status of Service:  Completed, signed off  Medicare Important Message Given:  Yes Date Medicare IM Given:  11/09/14 Medicare IM give by:  Ellan Lambert, RN, BSN  Date Additional Medicare IM Given:  11/12/14 Additional Medicare Important Message give by:  Lorne Skeens, RN, MSN  If discussed at Long Length of Stay Meetings, dates discussed:    Additional Comments:   Reinaldo Raddle, RN, BSN  Trauma/Neuro ICU Case Manager [phone]

## 2014-11-12 NOTE — Progress Notes (Signed)
SATURATION QUALIFICATIONS: (This note is used to comply with regulatory documentation for home oxygen)  Patient Saturations on Room Air at Rest = 77%  Patient Saturations on Room Air while Ambulating = 72%  Patient Saturations on 2 Liters of oxygen while Ambulating =88 %  Please briefly explain why patient needs home oxygen:pt is dyspneic when walking and at rest.

## 2014-11-12 NOTE — Discharge Planning (Signed)
AVS reviewed with patient's wife and copy given.  Wife gives self insulin and states she will be good with his pen.  Discussed SQ Lovenox with wife and CM who states HHRN will f/u with injections. Home 02 and other equipment to be delivered to home, daughter there waiting.  Instructions given to assist pt's nurse Willette Alma.

## 2014-11-12 NOTE — Evaluation (Signed)
Physical Therapy Evaluation Patient Details Name: Stuart Vaughan MRN: [id number] DOB: 1945/12/13 Today's Date: 11/12/2014   History of Present Illness  69 year old M Hx hypertension and hyperlipidemia who had been experiencing increasing low back pain over several weeks. Imaging revealed metastatic cancer unknown primary w/ extensive pulm and liver mets, pancreatic mass, sacral T2 and brain mets, and soft tissue defect in proximal colon   Clinical Impression  Patient demonstrates deficits in functional mobility as indicated below. Will need continued skilled PT to address deficits and maximize function. Will see as indicated and progress as tolerated. If family unable to provide physical assist will need SNF. Recommend Palliative care consult. OF NOTE: instability noted with gait, decreased activity tolerance during mobility. ambulated on 2 liters with O2 saturations dropping into low 80s and patient with increased WOB and DOE. Poor ability to rebound. O2 increased to 4 liters and patient required increased time sitting rest. improved to 91% with extended rest.  HR elevated to 134. Max cues for nasal inhalation and controlled breathing.    Follow Up Recommendations Home health PT;Supervision/Assistance - 24 hour (if family unable to provide physical assist will need SNF)    Equipment Recommendations  3in1 (PT);Wheelchair (measurements PT);Wheelchair cushion (measurements PT)    Recommendations for Other Services   Palliative care consult recommended    Precautions / Restrictions Precautions Precautions: Fall (oxygen) Restrictions Weight Bearing Restrictions: No      Mobility  Bed Mobility               General bed mobility comments: received in chair  Transfers Overall transfer level: Needs assistance Equipment used: Rolling walker (2 wheeled) Transfers: Sit to/from Stand Sit to Stand: Min assist         General transfer comment: Min assist for various surfaces inclduing  chair and toilet. Assist for safety and stability. Cues for hand placement and use of RW   Ambulation/Gait Ambulation/Gait assistance: Min assist Ambulation Distance (Feet): 50 Feet Assistive device: Rolling walker (2 wheeled) Gait Pattern/deviations: Step-through pattern;Decreased stride length;Shuffle;Trunk flexed;Narrow base of support;Drifts right/left Gait velocity: decreased Gait velocity interpretation: Below normal speed for age/gender General Gait Details: instability noted with gait, decreased activity tolerance during mobility. ambulated on 2 liters with O2 saturations dropping into low 80s and patient with increased WOB and DOE. Poor ability to rebound. O2 increased to 4 liters and patient required increased time sitting rest. improved to 91% with extended rest.  HR elevated to 134. Max cues for nasal inhalation and controlled breathing.  Stairs            Wheelchair Mobility    Modified Rankin (Stroke Patients Only)       Balance Overall balance assessment: Needs assistance Sitting-balance support: Feet supported Sitting balance-Leahy Scale: Fair     Standing balance support: Bilateral upper extremity supported;During functional activity Standing balance-Leahy Scale: Poor Standing balance comment: required assist for stability during hygiene and functional tasks                             Pertinent Vitals/Pain Pain Assessment: No/denies pain    Home Living Family/patient expects to be discharged to:: Private residence Living Arrangements: Spouse/significant other Available Help at Discharge: Family;Available 24 hours/day Type of Home: House Home Access: Stairs to enter   [employer] of Steps: 2 Home Layout: One level Home Equipment: Walker - 2 wheels;Cane - single point;Shower seat      Prior Function Level of  Independence: Independent               Hand Dominance   Dominant Hand: Right    Extremity/Trunk Assessment    Upper Extremity Assessment: Generalized weakness           Lower Extremity Assessment: LLE deficits/detail   LLE Deficits / Details: decreased sensation distal LLE as well as buttocks and groin. 3+/5 weakness noted.   Cervical / Trunk Assessment:  (decreased sensation groin and buttocks)  Communication   Communication: No difficulties  Cognition Arousal/Alertness: Awake/alert Behavior During Therapy: WFL for tasks assessed/performed Overall Cognitive Status: No family/caregiver present to determine baseline cognitive functioning                      General Comments      Exercises        Assessment/Plan    PT Assessment Patient needs continued PT services  PT Diagnosis Difficulty walking;Generalized weakness   PT Problem List Decreased strength;Decreased activity tolerance;Decreased balance;Decreased mobility;Decreased knowledge of use of DME;Decreased safety awareness;Cardiopulmonary status limiting activity  PT Treatment Interventions DME instruction;Gait training;Stair training;Functional mobility training;Therapeutic activities;Therapeutic exercise;Balance training;Cognitive remediation;Patient/family education   PT Goals (Current goals can be found in the Care Plan section) Acute Rehab PT Goals Patient Stated Goal: to go home PT Goal Formulation: With patient Time For Goal Achievement: 11/26/14 Potential to Achieve Goals: Fair    Frequency Min 3X/week   Barriers to discharge        Co-evaluation PT/OT/SLP Co-Evaluation/Treatment: Yes Reason for Co-Treatment: Complexity of the patient's impairments (multi-system involvement) PT goals addressed during session: Mobility/safety with mobility;Other (comment) (endurance)         End of Session Equipment Utilized During Treatment: Gait belt;Oxygen Activity Tolerance: Patient limited by fatigue Patient left: in chair;with call bell/phone within reach Nurse Communication: Mobility status;Precautions          Time: 7591-6384 PT Time Calculation (min) (ACUTE ONLY): 19 min   Charges:   PT Evaluation $Initial PT Evaluation Tier I: 1 Procedure     PT G CodesDuncan Dull 2014-12-11, 12:45 PM  Alben Deeds, [place] DPT  [phone]

## 2014-11-12 NOTE — Progress Notes (Signed)
11/12/2014 Called [facility] at [phone]. Talked to [place]. ENOXAPARIN is covered as a TIER 4 medication. No Prior Authorization required. Insurance will cover a maximum of 2 syringes per day. Patient's retail pharmacy co-payment will be $95.00 for a 30 Day Supply. Patient's mail order pharmacy co-payment will be $275.00 for a 90 Day Supply. Medication available at several retail pharmacies.    [facility].    Reinaldo Raddle, RN, BSN  Trauma/Neuro ICU Case Manager [phone]

## 2014-11-13 DIAGNOSIS — D500 Iron deficiency anemia secondary to blood loss (chronic): Secondary | ICD-10-CM | POA: Diagnosis not present

## 2014-11-13 DIAGNOSIS — I10 Essential (primary) hypertension: Secondary | ICD-10-CM | POA: Diagnosis not present

## 2014-11-13 DIAGNOSIS — J449 Chronic obstructive pulmonary disease, unspecified: Secondary | ICD-10-CM | POA: Diagnosis not present

## 2014-11-13 DIAGNOSIS — J441 Chronic obstructive pulmonary disease with (acute) exacerbation: Secondary | ICD-10-CM | POA: Diagnosis not present

## 2014-11-13 DIAGNOSIS — Z7901 Long term (current) use of anticoagulants: Secondary | ICD-10-CM | POA: Diagnosis not present

## 2014-11-13 DIAGNOSIS — R339 Retention of urine, unspecified: Secondary | ICD-10-CM | POA: Diagnosis not present

## 2014-11-13 DIAGNOSIS — K922 Gastrointestinal hemorrhage, unspecified: Secondary | ICD-10-CM | POA: Diagnosis not present

## 2014-11-16 ENCOUNTER — Telehealth: Payer: Self-pay | Admitting: Radiation Oncology

## 2014-11-16 DIAGNOSIS — C787 Secondary malignant neoplasm of liver and intrahepatic bile duct: Secondary | ICD-10-CM | POA: Diagnosis not present

## 2014-11-16 DIAGNOSIS — C7951 Secondary malignant neoplasm of bone: Secondary | ICD-10-CM | POA: Diagnosis not present

## 2014-11-16 DIAGNOSIS — J441 Chronic obstructive pulmonary disease with (acute) exacerbation: Secondary | ICD-10-CM | POA: Diagnosis not present

## 2014-11-16 DIAGNOSIS — C801 Malignant (primary) neoplasm, unspecified: Secondary | ICD-10-CM | POA: Diagnosis not present

## 2014-11-16 NOTE — Telephone Encounter (Signed)
Phoned patient's home and spoke with his wife, Vickii Chafe. She confirms that her husband consult with Dr. Orlene Erm today and plans to return on Wednesday for CT/Sim. Patient states, "[facility] [place] [facility] came out Friday and did paperwork on my husband." She goes on to say that she understood they would be sending a nurse to her home to manage "the tube in her husband's bladder." She denies seeing anyone from [facility] since Friday and that her husband's "man parts are purple." Explained her husband needed to present to an ED for evaluation. Wife states, "no that nurse just needs to come out." Phoned Lurlean Leyden, RN for [place] with these findings.

## 2014-11-18 DIAGNOSIS — Z510 Encounter for antineoplastic radiation therapy: Secondary | ICD-10-CM | POA: Diagnosis not present

## 2014-11-18 DIAGNOSIS — C787 Secondary malignant neoplasm of liver and intrahepatic bile duct: Secondary | ICD-10-CM | POA: Diagnosis not present

## 2014-11-18 DIAGNOSIS — C801 Malignant (primary) neoplasm, unspecified: Secondary | ICD-10-CM | POA: Diagnosis not present

## 2014-11-18 DIAGNOSIS — C7931 Secondary malignant neoplasm of brain: Secondary | ICD-10-CM | POA: Diagnosis not present

## 2014-11-27 DEATH — deceased

## 2016-07-31 IMAGING — CR DG CHEST 1V PORT
1 series · 1 of 1 positions shown · non-contrast
Comparison: Chest x-ray 01/16/2001.

CLINICAL DATA: 69-year-old male with shortness of breath on
exertion. No chest pain.

EXAM:
PORTABLE CHEST - 1 VIEW

[AP]
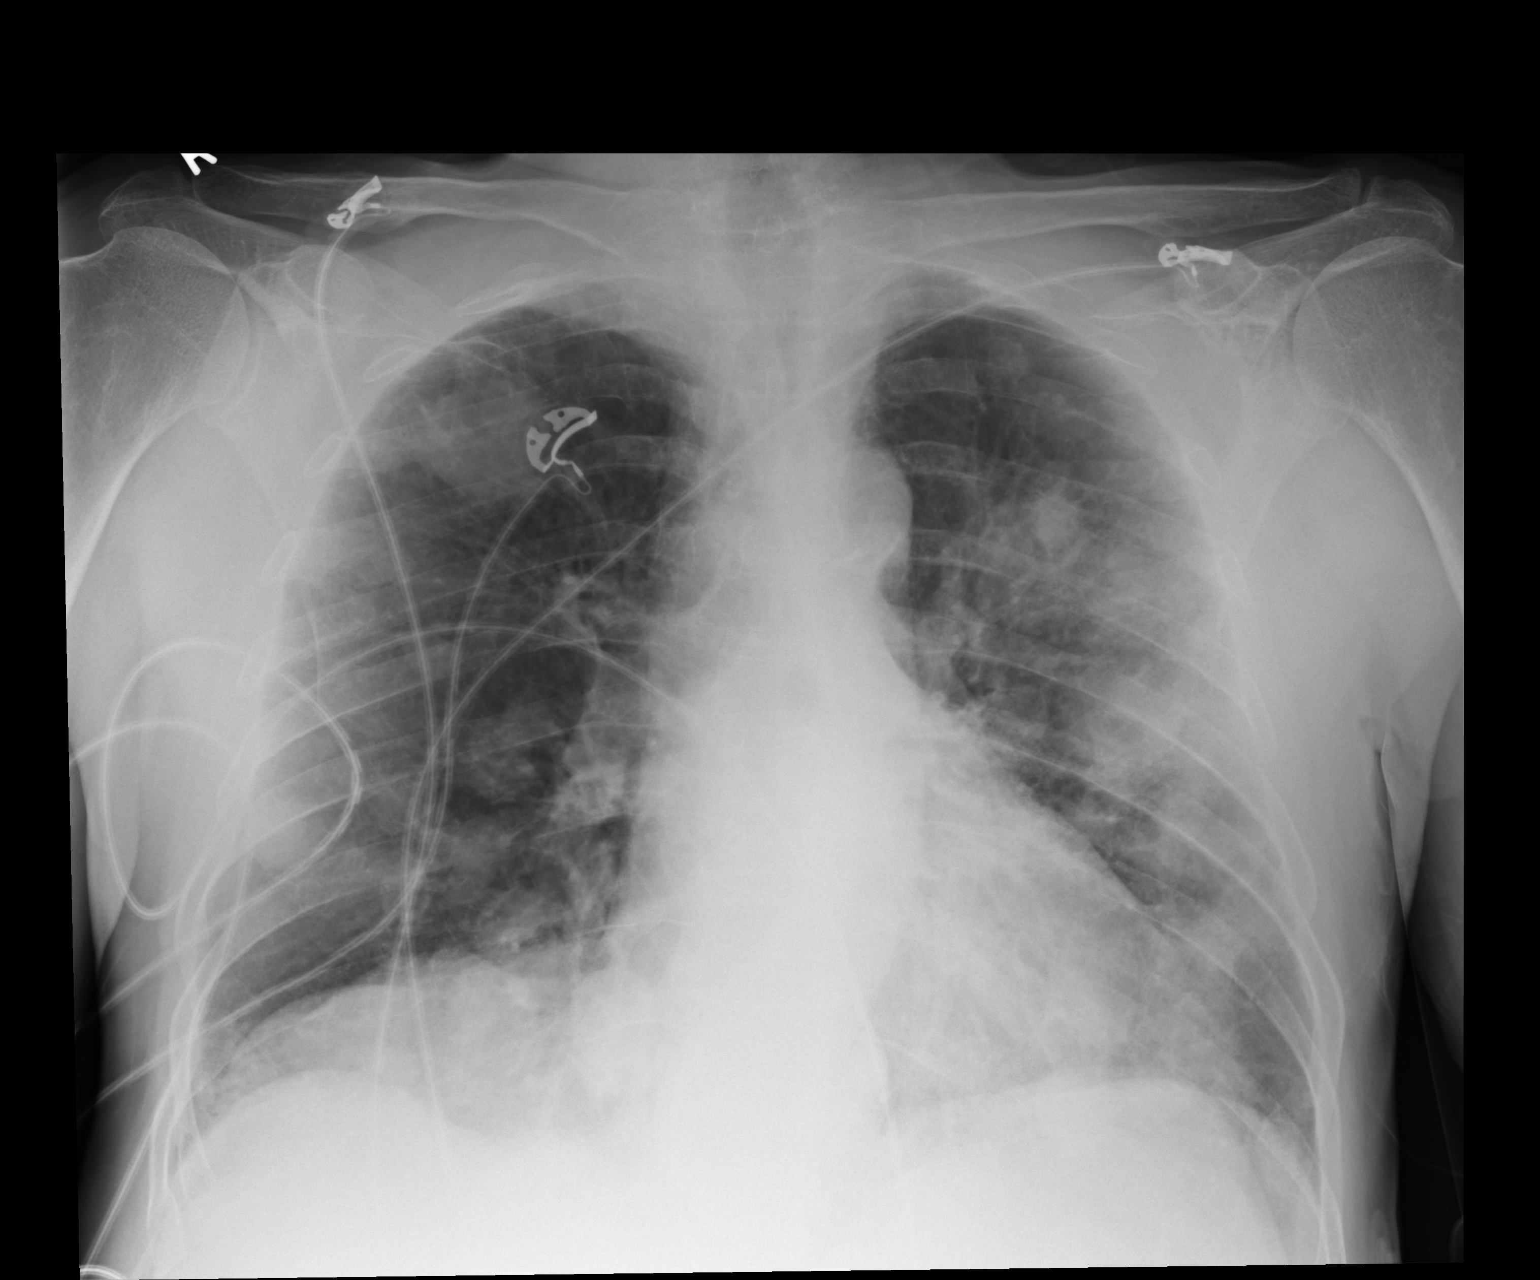

[1 of 1 positions shown; findings below may reference images not displayed]

FINDINGS: There are numerous pulmonary nodules and masses throughout the lungs
bilaterally, highly concerning for widespread metastatic disease.
Largest of these measure up to 4 cm in the right upper lobe. No
definite pleural effusions. No evidence of pulmonary edema. Heart
size is normal. Upper mediastinal contours are within normal limits.
Atherosclerosis in the thoracic aorta.
IMPRESSION: 1. Findings, as above, highly concerning for widespread metastatic
disease to the lungs. Further evaluation with contrast enhanced
chest CT is recommended at this time.
2. Atherosclerosis.

## 2016-08-01 IMAGING — CT CT ABD-PELV W/O CM
2 of 5 series · 13 of 36 positions shown, 16 images · non-contrast
Comparison: None.

CLINICAL DATA: 69-year-old with GI bleed and pulmonary embolus.

EXAM:
CT CHEST, ABDOMEN AND PELVIS WITHOUT CONTRAST
TECHNIQUE: Multidetector CT imaging of the chest, abdomen and pelvis was
performed following the standard protocol without IV contrast.

[Series 4: thins · axial · 0.73mm/px · z∈[-397,+125]mm · 10 of 763 slices shown, 13 images]
[im 73/763  mediastinal]
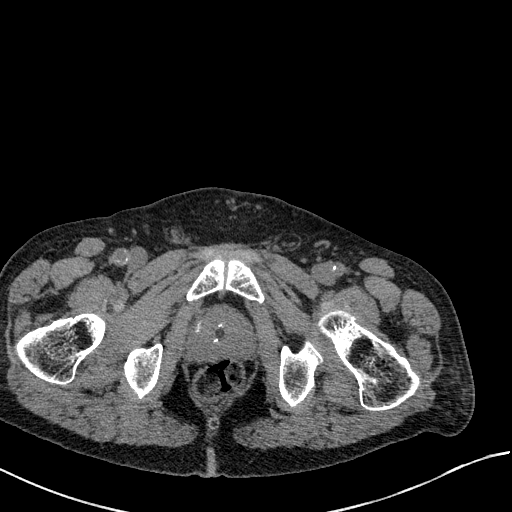
[im 73/763  lung]
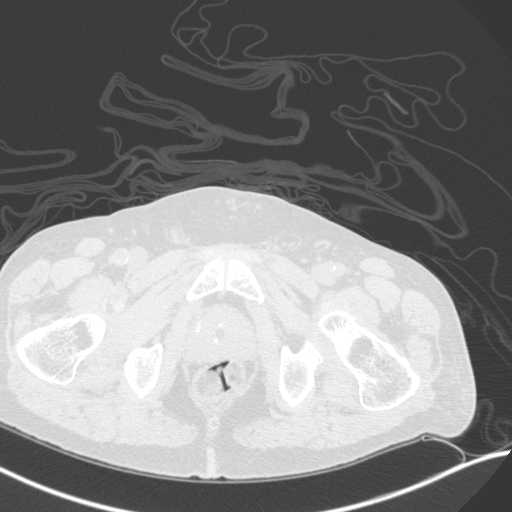
[im 146/763  lung]
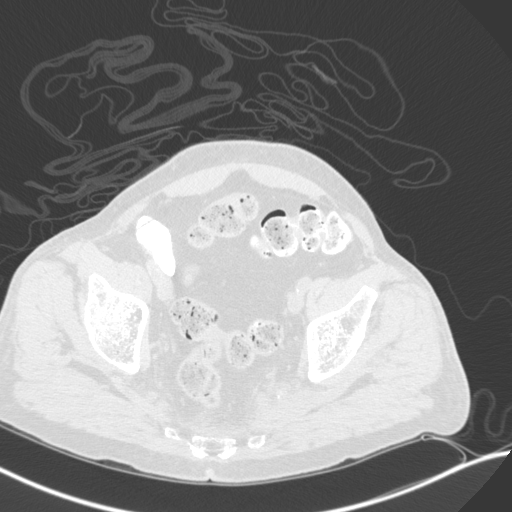
[im 218/763  lung]
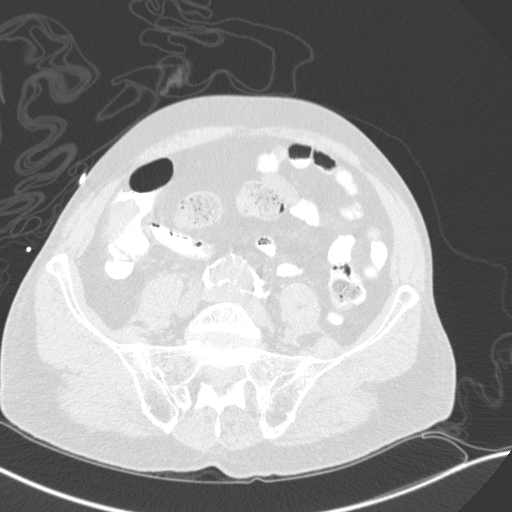
[im 291/763  lung]
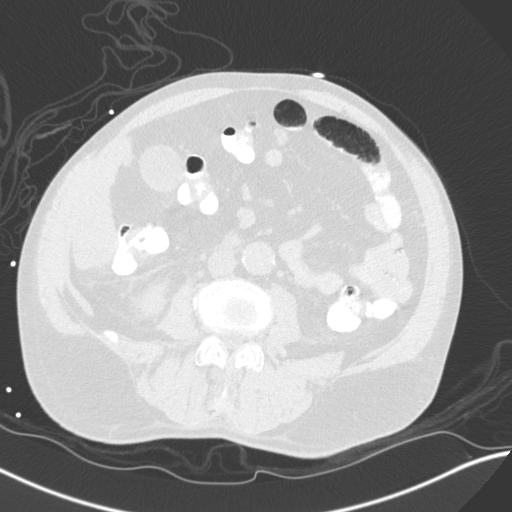
[im 363/763  mediastinal]
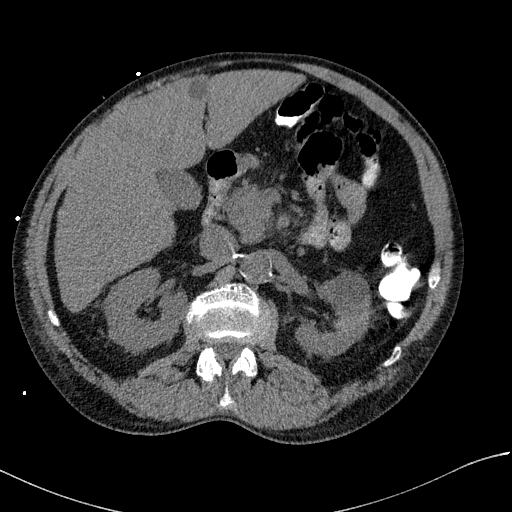
[im 363/763  lung]
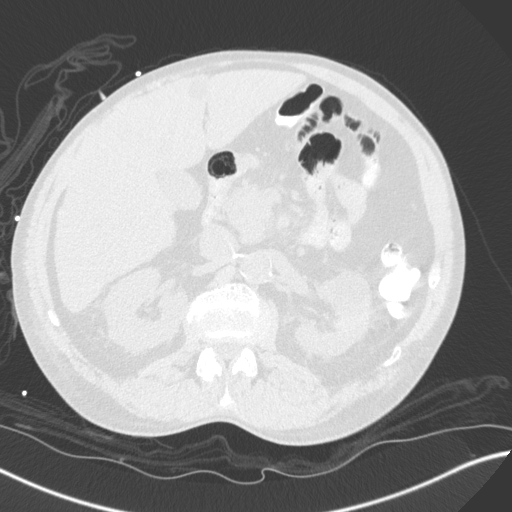
[im 436/763  lung]
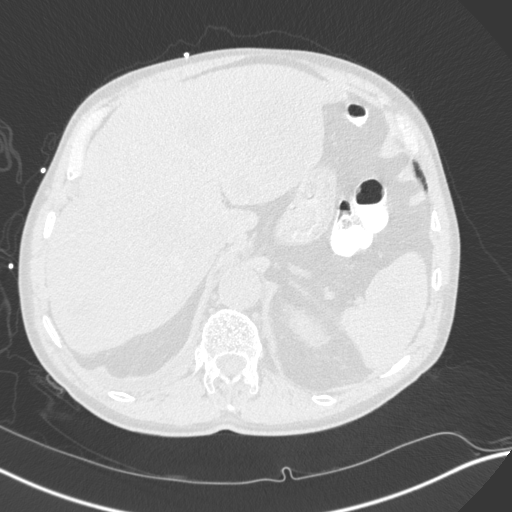
[im 509/763  lung]
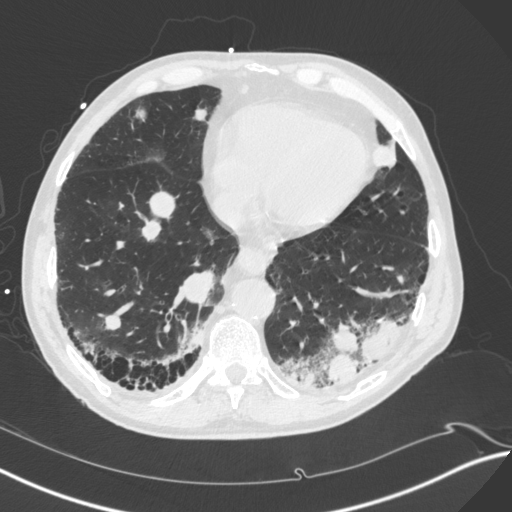
[im 581/763  lung]
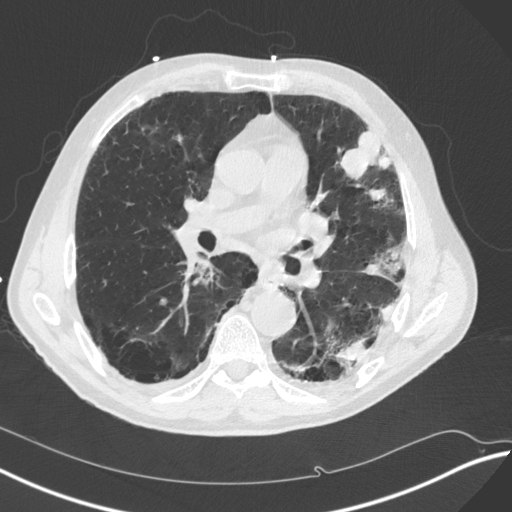
[im 654/763  mediastinal]
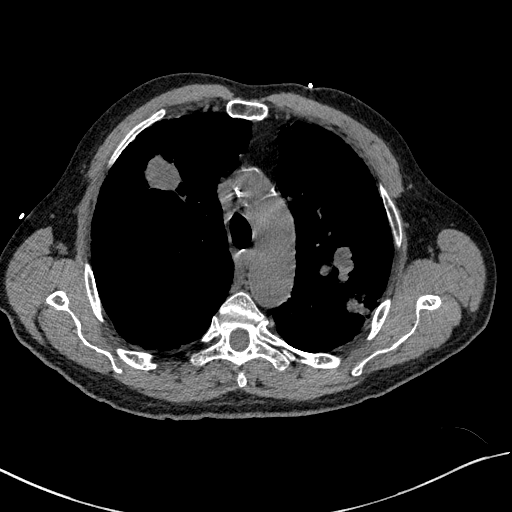
[im 654/763  lung]
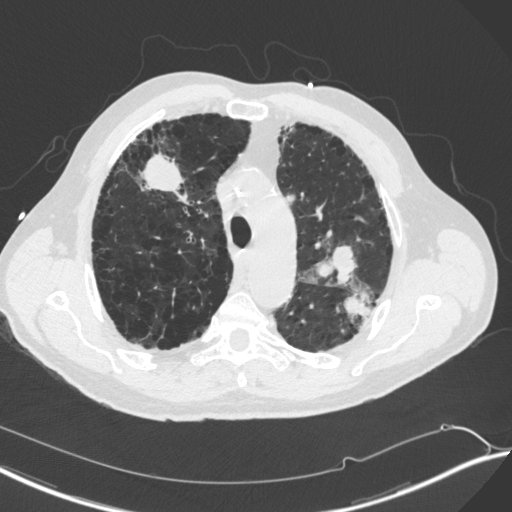
[im 726/763  lung]
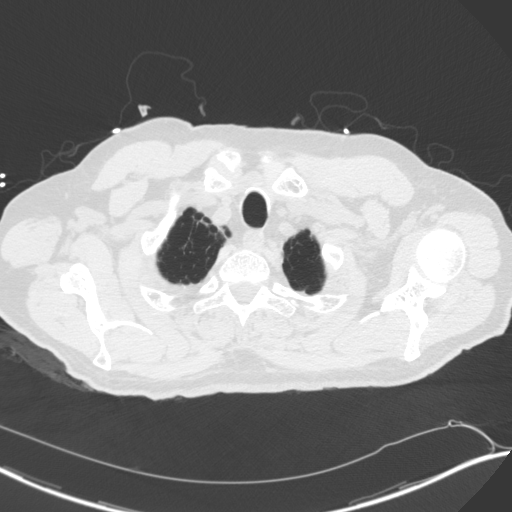

[Series 5: coronal · coronal · 0.86mm/px · 3 of 103 slices shown]
[im 21/103  lung]
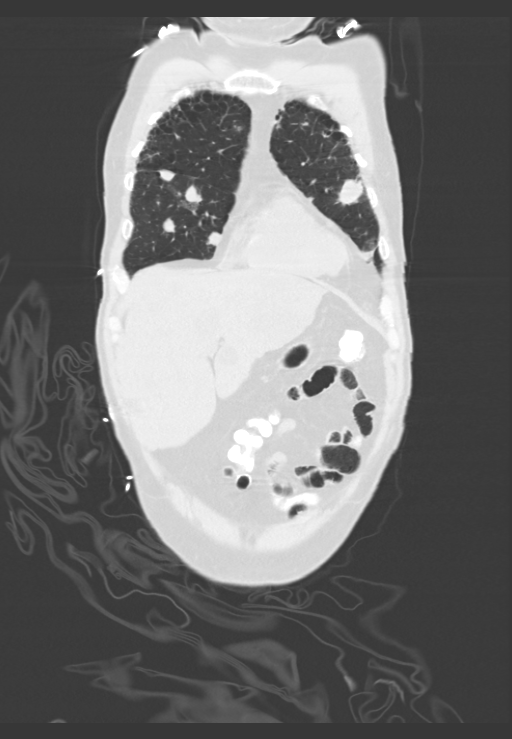
[im 41/103  lung]
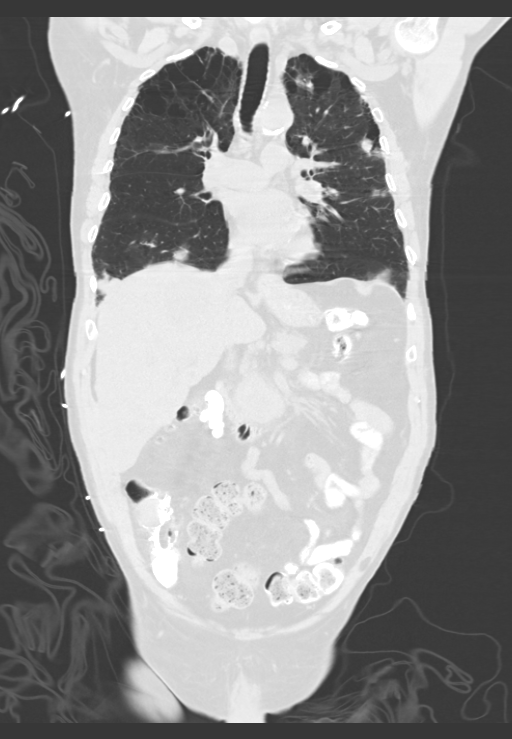
[im 62/103  lung]
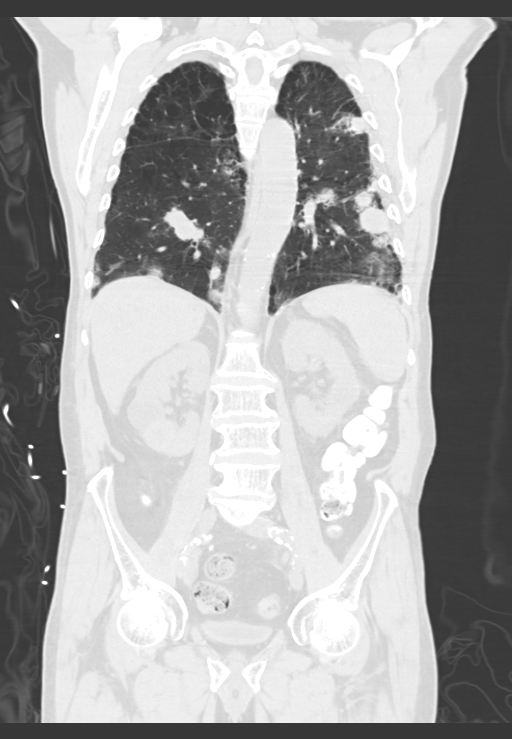

[13 of 36 positions shown; findings below may reference images not displayed]

FINDINGS: CT CHEST FINDINGS

Mediastinum: The heart size is normal. No pericardial effusion.
Small hiatal hernia noted. Aortic atherosclerosis is identified.
There also calcifications within the LAD and left circumflex
coronary artery. Left paratracheal lymph node measures 1.4 cm, image
23/series 2. Sub- carinal lymph node measures 1.1 cm, image
28/series 2.

Lungs/Pleura: Advanced changes of centrilobular and paraseptal
emphysema. Numerous pulmonary nodules are identified bilaterally.
Index nodule within the right upper lobe measures 3.4 cm, image
17/series 3. In the left upper lobe there is a nodule measuring
cm, image 22/series 3. Within the right lower lobe there is a 1.9 cm
nodule, image 42/series 3.

Musculoskeletal: No aggressive lytic or sclerotic bone lesions. T8
compression fracture is again noted. T2 lesion identified on recent
MRI is not well seen.

CT ABDOMEN AND PELVIS FINDINGS

Hepatobiliary: Extensive liver metastases are identified involving
both lobes. Large index lesion within the right hepatic lobe
measures 6.2 cm, image 51/series 2. Index lesion within the lateral
segment of left lobe measures 6.3 cm, image 51/series 2. Index
lesion within the inferior right hepatic lobe measures 2.3 cm, image
69/series 2. The gallbladder appears normal. No biliary dilatation.

Pancreas: Masslike enlargement of the head of pancreas measures
cm, image 68/series 2.

Spleen: There is a low attenuation structure within the central
spleen measuring 1.9 cm, image 53/series 2.

Adrenals/Urinary Tract: The adrenal glands are both normal.
Bilateral renal cysts are identified. These are incompletely
characterized without IV contrast. The urinary bladder is collapsed
around a Foley catheter balloon.

Stomach/Bowel: Hiatal hernia noted. The stomach is otherwise
unremarkable. The small bowel loops have a normal course and
caliber. A 3.3 cm soft tissue attenuating filling defects arise from
the lateral wall of the ascending colon, image 87/ series 2. This is
of uncertain significance.

Vascular/Lymphatic: Calcified atherosclerotic disease involves the
abdominal aorta. Infrarenal abdominal aortic aneurysm measures
cm.

Reproductive: Prostate gland appears enlarged. Symmetric appearance
of the seminal vesicles.

Other: Increased presacral soft tissue along the left is identified,
image 95/series 2 and is worrisome for tumor.

Musculoskeletal: Large mass involving the sacrum is identified. This
measures at least 6.3 cm, image 91/series 2. This is better seen on
MRI from 11/04/14
IMPRESSION: 1. Extensive pulmonary metastasis and liver metastasis.
2. Mass involving head of pancreas may represent a focus of
metastatic disease or primary pancreatic carcinoma.
3. Bone metastasis are better seen on MRI from 11/04/14 an 11/05/2014.
There is a large mass involving the central portion of the sacrum
with associated increased left-sided presacral soft tissue.
4. Aortic atherosclerosis.
5. Infrarenal abdominal aortic aneurysm. Recommend followup by
ultrasound in 3 years. This recommendation follows ACR consensus
guidelines: White Paper of the ACR Incidental Findings Committee II
on Vascular Findings. [HOSPITAL] 8801; [DATE]
6. Indeterminate soft tissue attenuating filling defect within the
proximal colon may represent neoplasm.

## 2016-08-02 IMAGING — US US BIOPSY
1 series · 5 of 5 positions shown · non-contrast
Comparison: none

CLINICAL DATA: 69-year-old male with extensive multifocal
metastatic disease of unknown primary. There is a mass in the region
of the pancreatic head which could be the primary lesion or an
additional area of metastatic adenopathy. Liver lesions are most
accessible for safe percutaneous biopsy.
TECHNIQUE: Informed consent was obtained from the patient following explanation
of the procedure, risks, benefits and alternatives. The patient
understands, agrees and consents for the procedure. All questions
were addressed. A time out was performed.

[Series 1: us biopsy · 0.26mm/px · 5 acquisitions, 5 frames shown]
[im 1/5]
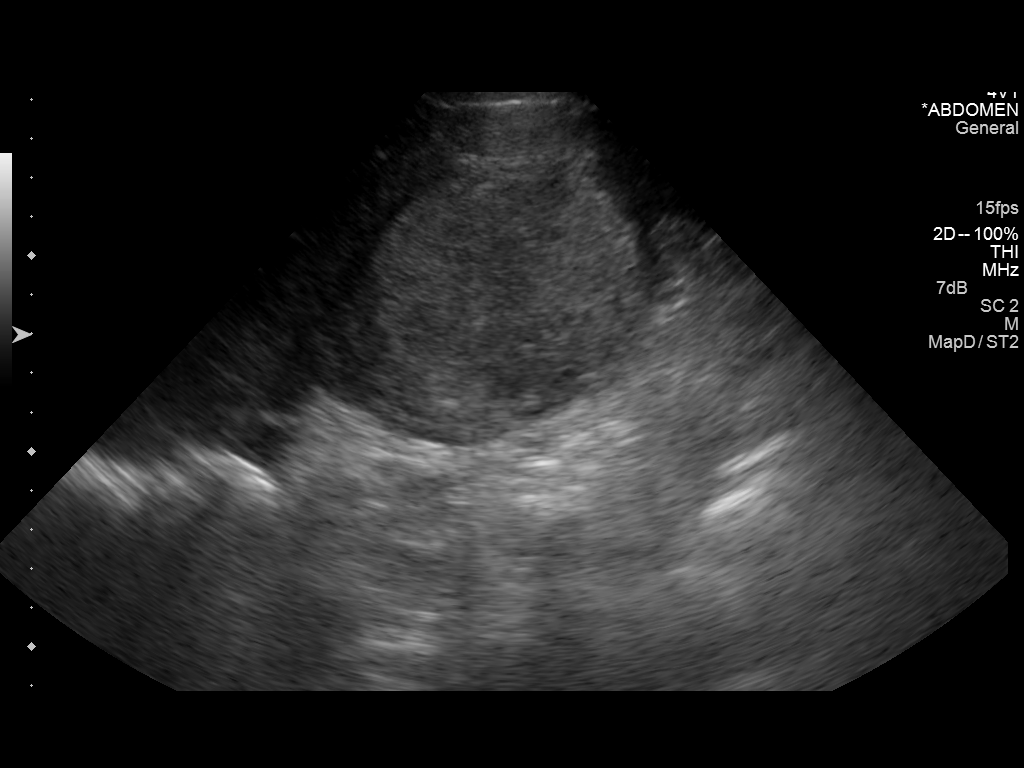
[im 2/5]
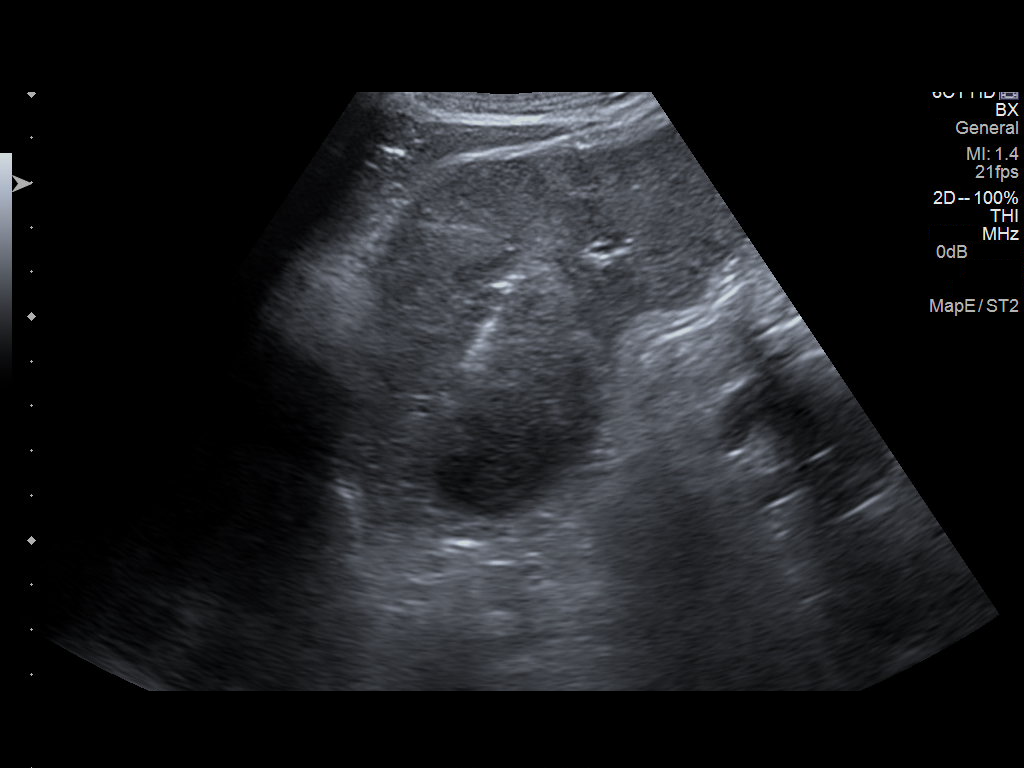
[im 3/5]
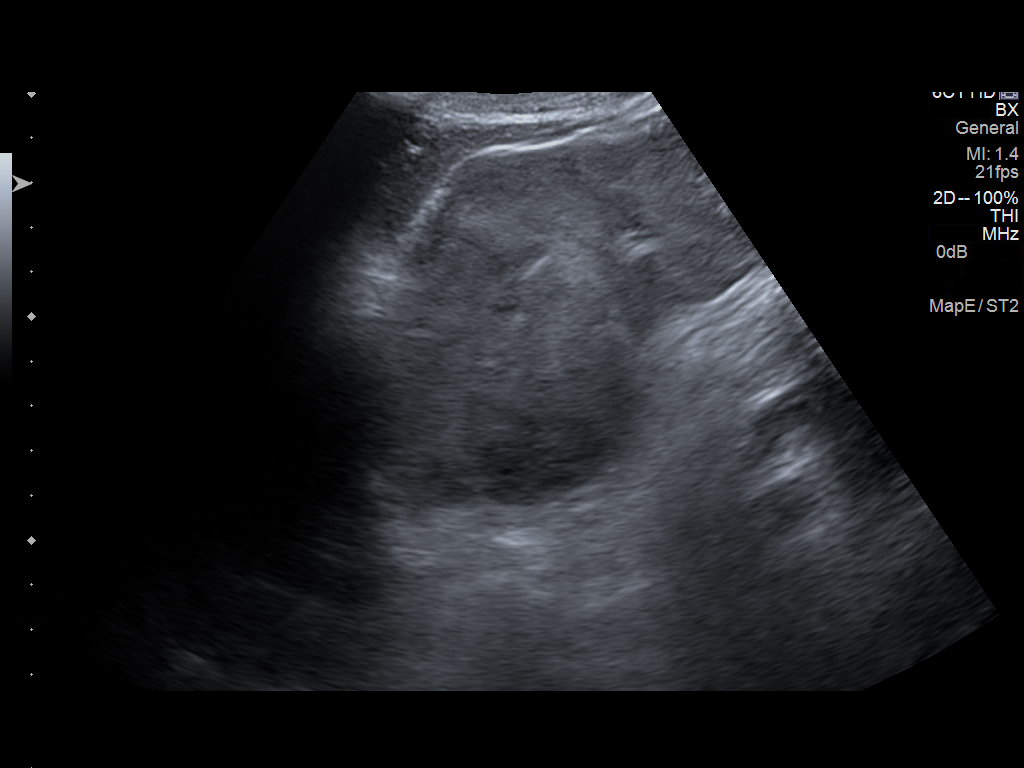
[im 4/5]
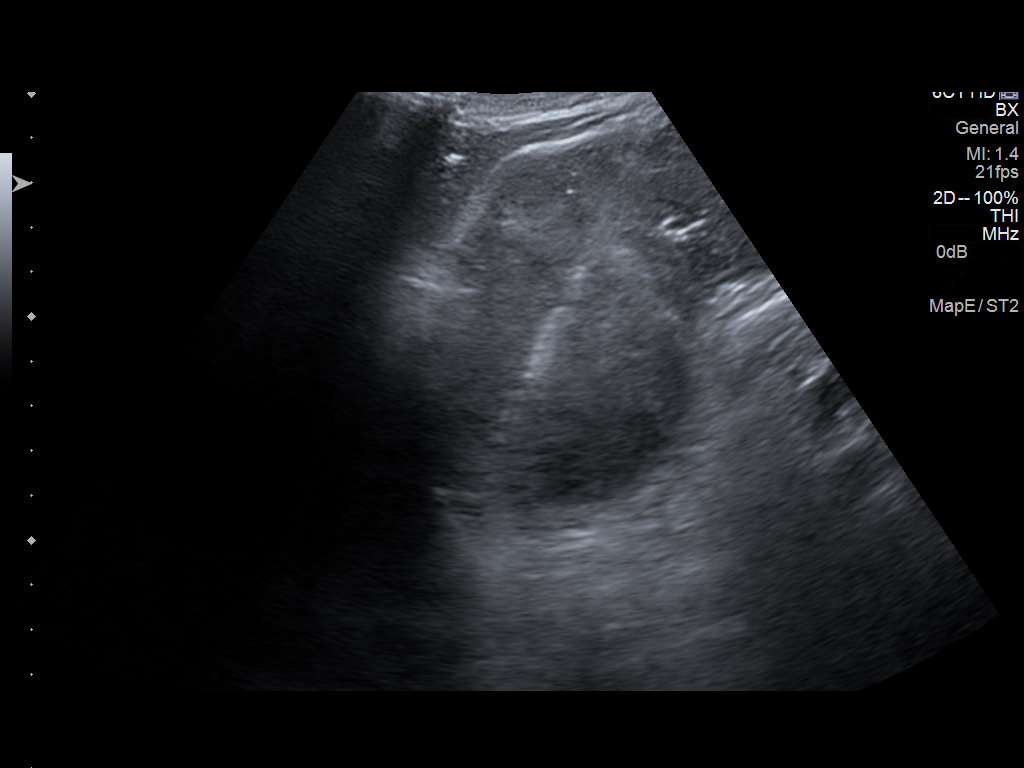
[im 5/5]
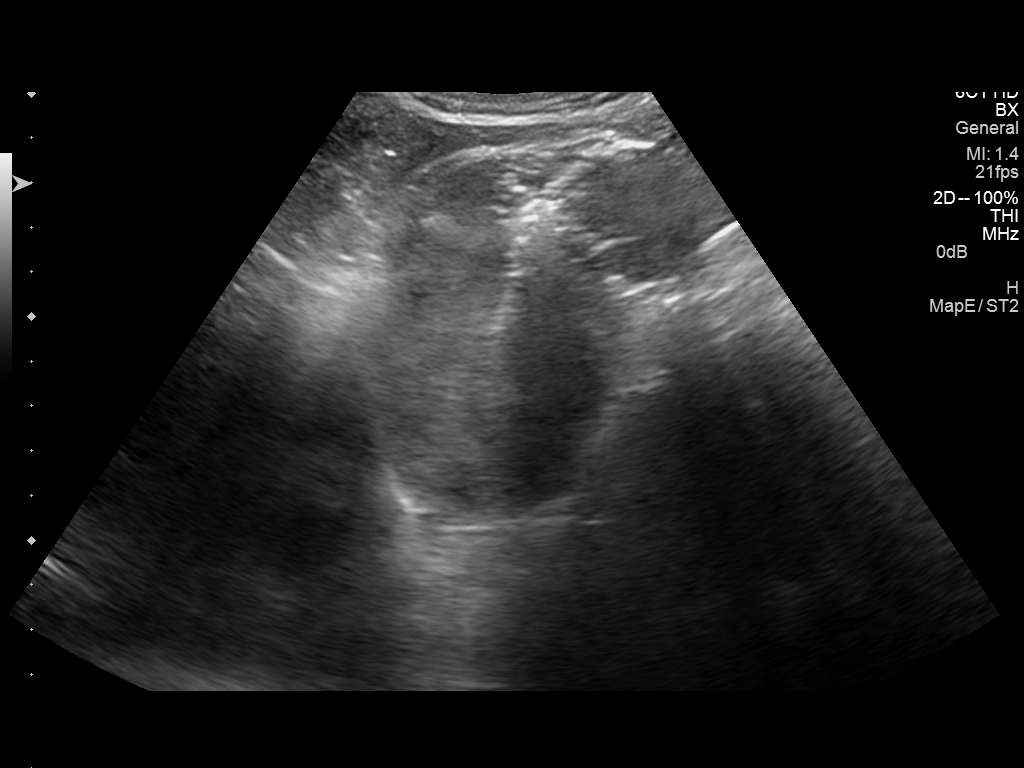

[5 of 5 positions shown; findings below may reference images not displayed]

EXAM:
ULTRASOUND BIOPSY CORE LIVER

Date: 11/07/2014

PROCEDURE:
1. Ultrasound-guided core biopsy of mass in the left hepatic lobe.

ANESTHESIA/SEDATION:
Moderate (conscious) sedation was used. 1 mg Versed, 50 mcg Fentanyl
were administered intravenously. The patient's vital signs were
monitored continuously by radiology nursing throughout the
procedure.

Sedation Time: 13 minutes

MEDICATIONS:
None additional
The right upper quadrant was interrogated with ultrasound. Numerous
hepatic lesions are identified. And approximately 6.7 cm mass is
identified in the posterior superior aspect of the left hepatic
lobe. This presents a suitable lesion for biopsy. An accessible skin
entry site was selected and marked.

The region was then sterilely prepped and draped in standard fashion
using chlorhexidine skin prep. Local anesthesia was attained by
infiltration with 1% lidocaine. A small dermatotomy was made. Under
real-time sonographic guidance, a 17 gauge introducer needle was
advanced into the margin of the mass. Multiple 18 gauge core
biopsies were then coaxially obtained with the Paul[employer] N Cee[employer] automated
biopsy device.

As the introducer needle was removed the biopsy tract was embolized
with a Gel-Foam slurry. The patient tolerated the procedure well.

Biopsy specimens were placed in formalin and delivered to pathology
for further analysis.

COMPLICATIONS:
None
IMPRESSION: Technically successful ultrasound-guided core biopsy of left hepatic
mass.
